# Patient Record
Sex: Male | Born: 1960 | Race: White | Hispanic: No | Marital: Married | Smoking: Never smoker
Health system: Southern US, Community
[De-identification: ages and names within clinical notes are randomized; demographics above are authoritative.]

## PROBLEM LIST (undated history)

## (undated) DIAGNOSIS — Z9989 Dependence on other enabling machines and devices: Secondary | ICD-10-CM

## (undated) DIAGNOSIS — G4733 Obstructive sleep apnea (adult) (pediatric): Secondary | ICD-10-CM

## (undated) DIAGNOSIS — G4730 Sleep apnea, unspecified: Secondary | ICD-10-CM

## (undated) DIAGNOSIS — I341 Nonrheumatic mitral (valve) prolapse: Secondary | ICD-10-CM

## (undated) DIAGNOSIS — K219 Gastro-esophageal reflux disease without esophagitis: Secondary | ICD-10-CM

## (undated) DIAGNOSIS — E039 Hypothyroidism, unspecified: Secondary | ICD-10-CM

## (undated) DIAGNOSIS — R011 Cardiac murmur, unspecified: Secondary | ICD-10-CM

## (undated) HISTORY — PX: COLONOSCOPY: SHX174

## (undated) HISTORY — DX: Sleep apnea, unspecified: G47.30

## (undated) HISTORY — PX: ESOPHAGOGASTRODUODENOSCOPY: SHX1529

## (undated) HISTORY — PX: TONSILLECTOMY: SUR1361

## (undated) HISTORY — PX: GANGLION CYST EXCISION: SHX1691

## (undated) HISTORY — PX: VASECTOMY: SHX75

---

## 2006-02-02 ENCOUNTER — Ambulatory Visit: Payer: Self-pay | Admitting: Gastroenterology

## 2006-11-17 ENCOUNTER — Ambulatory Visit: Payer: Self-pay | Admitting: Gastroenterology

## 2007-01-06 ENCOUNTER — Ambulatory Visit: Payer: Self-pay | Admitting: Gastroenterology

## 2011-09-03 LAB — HM COLONOSCOPY

## 2013-07-07 ENCOUNTER — Ambulatory Visit: Payer: Self-pay | Admitting: Family Medicine

## 2014-07-16 LAB — LIPID PANEL
CHOLESTEROL: 200 mg/dL (ref 0–200)
HDL: 38 mg/dL (ref 35–70)
LDL Cholesterol: 136 mg/dL
Triglycerides: 130 mg/dL (ref 40–160)

## 2014-07-16 LAB — TSH: TSH: 5.34 u[IU]/mL (ref <=5.90)

## 2014-07-16 LAB — BASIC METABOLIC PANEL
BUN: 16 mg/dL (ref 4–21)
CREATININE: 1.1 mg/dL (ref <=1.3)
Glucose: 96 mg/dL
POTASSIUM: 4.4 mmol/L (ref 3.4–5.3)
SODIUM: 142 mmol/L (ref 137–147)

## 2014-07-16 LAB — HEPATIC FUNCTION PANEL
ALT: 25 U/L (ref 10–40)
AST: 15 U/L (ref 14–40)
Alkaline Phosphatase: 44 U/L (ref 25–125)

## 2014-12-21 ENCOUNTER — Telehealth: Payer: Self-pay | Admitting: Gastroenterology

## 2014-12-21 NOTE — Telephone Encounter (Signed)
Ginger please return phone call. They need to make one to have his esophagus stretched.

## 2015-01-01 ENCOUNTER — Other Ambulatory Visit: Payer: Self-pay

## 2015-01-01 NOTE — Telephone Encounter (Signed)
Pt returned call to schedule EGD. Appt scheduled at [facility] for July 8th. Pt aware of location and instructions.

## 2015-01-01 NOTE — Telephone Encounter (Signed)
LVM for pt to return my call.

## 2015-01-09 ENCOUNTER — Encounter: Payer: Self-pay | Admitting: *Deleted

## 2015-01-10 NOTE — Anesthesia Preprocedure Evaluation (Addendum)
Anesthesia Evaluation  Patient identified by MRN, date of birth, ID band Patient awake    Reviewed: Allergy & Precautions, NPO status   Airway Mallampati: III  TM Distance: <3 FB Neck ROM: full    Dental no notable dental hx.    Pulmonary neg pulmonary ROS,    Pulmonary exam normal       Cardiovascular negative cardio ROS Normal cardiovascular exam Hx MV Prolapse   Neuro/Psych negative neurological ROS  negative psych ROS   GI/Hepatic Neg liver ROS, GERD-  ,  Endo/Other  Hypothyroidism   Renal/GU negative Renal ROS  negative genitourinary   Musculoskeletal   Abdominal   Peds  Hematology negative hematology ROS (+)   Anesthesia Other Findings   Reproductive/Obstetrics                             Anesthesia Physical Anesthesia Plan  ASA: II  Anesthesia Plan: MAC   Post-op Pain Management:    Induction: Intravenous  Airway Management Planned: Nasal Cannula  Additional Equipment:   Intra-op Plan:   Post-operative Plan:   Informed Consent: I have reviewed the patients History and Physical, chart, labs and discussed the procedure including the risks, benefits and alternatives for the proposed anesthesia with the patient or authorized representative who has indicated his/her understanding and acceptance.     Plan Discussed with:   Anesthesia Plan Comments:         Anesthesia Quick Evaluation

## 2015-01-17 NOTE — Discharge Instructions (Signed)

## 2015-01-18 ENCOUNTER — Encounter
Admission: RE | Disposition: A | Discharge status: Home / Self Care | Payer: Self-pay | Source: Ambulatory Visit | Attending: Gastroenterology

## 2015-01-18 ENCOUNTER — Ambulatory Visit: Payer: 59 | Admitting: Anesthesiology

## 2015-01-18 ENCOUNTER — Other Ambulatory Visit: Payer: Self-pay | Admitting: Gastroenterology

## 2015-01-18 ENCOUNTER — Ambulatory Visit
Admission: RE | Admit: 2015-01-18 | Discharge: 2015-01-18 | Disposition: A | Discharge status: Home / Self Care | Payer: 59 | Source: Ambulatory Visit | Attending: Gastroenterology | Admitting: Gastroenterology

## 2015-01-18 DIAGNOSIS — K317 Polyp of stomach and duodenum: Secondary | ICD-10-CM | POA: Diagnosis not present

## 2015-01-18 DIAGNOSIS — E039 Hypothyroidism, unspecified: Secondary | ICD-10-CM | POA: Diagnosis not present

## 2015-01-18 DIAGNOSIS — K2950 Unspecified chronic gastritis without bleeding: Secondary | ICD-10-CM | POA: Insufficient documentation

## 2015-01-18 DIAGNOSIS — K219 Gastro-esophageal reflux disease without esophagitis: Secondary | ICD-10-CM | POA: Insufficient documentation

## 2015-01-18 DIAGNOSIS — K2970 Gastritis, unspecified, without bleeding: Secondary | ICD-10-CM | POA: Diagnosis not present

## 2015-01-18 DIAGNOSIS — R1310 Dysphagia, unspecified: Secondary | ICD-10-CM | POA: Insufficient documentation

## 2015-01-18 DIAGNOSIS — Z79899 Other long term (current) drug therapy: Secondary | ICD-10-CM | POA: Diagnosis not present

## 2015-01-18 DIAGNOSIS — K222 Esophageal obstruction: Secondary | ICD-10-CM

## 2015-01-18 HISTORY — PX: ESOPHAGOGASTRODUODENOSCOPY (EGD) WITH PROPOFOL: SHX5813

## 2015-01-18 HISTORY — DX: Nonrheumatic mitral (valve) prolapse: I34.1

## 2015-01-18 HISTORY — DX: Hypothyroidism, unspecified: E03.9

## 2015-01-18 HISTORY — DX: Gastro-esophageal reflux disease without esophagitis: K21.9

## 2015-01-18 SURGERY — ESOPHAGOGASTRODUODENOSCOPY (EGD) WITH PROPOFOL
Anesthesia: Monitor Anesthesia Care | Wound class: Clean Contaminated

## 2015-01-18 MED ORDER — ACETAMINOPHEN 325 MG PO TABS: 325.0000 mg | ORAL_TABLET | ORAL | Status: DC | PRN

## 2015-01-18 MED ORDER — LACTATED RINGERS IV SOLN
INTRAVENOUS | Status: DC
  Administered 2015-01-18: 11:00:00 via INTRAVENOUS

## 2015-01-18 MED ORDER — LACTATED RINGERS IV SOLN: INTRAVENOUS | Status: DC

## 2015-01-18 MED ORDER — SODIUM CHLORIDE 0.9 % IV SOLN: INTRAVENOUS | Status: DC

## 2015-01-18 MED ORDER — LIDOCAINE HCL (CARDIAC) 20 MG/ML IV SOLN
INTRAVENOUS | Status: DC | PRN
  Administered 2015-01-18: 50 mg via INTRAVENOUS

## 2015-01-18 MED ORDER — ACETAMINOPHEN 160 MG/5ML PO SOLN: 325.0000 mg | ORAL | Status: DC | PRN

## 2015-01-18 MED ORDER — PROPOFOL 10 MG/ML IV BOLUS
INTRAVENOUS | Status: DC | PRN
  Administered 2015-01-18 (×3): 30 mg via INTRAVENOUS
  Administered 2015-01-18: 70 mg via INTRAVENOUS
  Administered 2015-01-18: 40 mg via INTRAVENOUS

## 2015-01-18 MED ORDER — SODIUM CHLORIDE 0.9 % IV SOLN
1.5000 g | Freq: Once | INTRAVENOUS | Status: AC
  Administered 2015-01-18: 1.5 g via INTRAVENOUS

## 2015-01-18 MED ORDER — GLYCOPYRROLATE 0.2 MG/ML IJ SOLN
INTRAMUSCULAR | Status: DC | PRN
  Administered 2015-01-18: 0.2 mg via INTRAVENOUS

## 2015-01-18 SURGICAL SUPPLY — 39 items
BALLN DILATOR 10-12 8 (BALLOONS)
BALLN DILATOR 12-15 8 (BALLOONS) ×3
BALLN DILATOR 15-18 8 (BALLOONS)
BALLN DILATOR CRE 0-12 8 (BALLOONS)
BALLN DILATOR ESOPH 8 10 CRE (MISCELLANEOUS) IMPLANT
BALLOON DILATOR 12-15 8 (BALLOONS) ×1 IMPLANT
BALLOON DILATOR 15-18 8 (BALLOONS) IMPLANT
BALLOON DILATOR CRE 0-12 8 (BALLOONS) IMPLANT
BLOCK BITE 60FR ADLT L/F GRN (MISCELLANEOUS) ×3 IMPLANT
CANISTER SUCT 1200ML W/VALVE (MISCELLANEOUS) ×3 IMPLANT
FCP ESCP3.2XJMB 240X2.8X (MISCELLANEOUS)
FORCEPS BIOP RAD 4 LRG CAP 4 (CUTTING FORCEPS) ×3 IMPLANT
FORCEPS BIOP RJ4 240 W/NDL (MISCELLANEOUS)
FORCEPS ESCP3.2XJMB 240X2.8X (MISCELLANEOUS) IMPLANT
GOWN CVR UNV OPN BCK APRN NK (MISCELLANEOUS) ×2 IMPLANT
GOWN ISOL THUMB LOOP REG UNIV (MISCELLANEOUS) ×4
HEMOCLIP INSTINCT (CLIP) IMPLANT
INJECTOR VARIJECT VIN23 (MISCELLANEOUS) IMPLANT
KIT CO2 TUBING (TUBING) IMPLANT
KIT DEFENDO VALVE AND CONN (KITS) IMPLANT
KIT ENDO PROCEDURE OLY (KITS) ×3 IMPLANT
LIGATOR MULTIBAND 6SHOOTER MBL (MISCELLANEOUS) IMPLANT
MARKER SPOT ENDO TATTOO 5ML (MISCELLANEOUS) IMPLANT
PAD GROUND ADULT SPLIT (MISCELLANEOUS) IMPLANT
SNARE SHORT THROW 13M SML OVAL (MISCELLANEOUS) IMPLANT
SNARE SHORT THROW 30M LRG OVAL (MISCELLANEOUS) IMPLANT
SPOT EX ENDOSCOPIC TATTOO (MISCELLANEOUS)
SUCTION POLY TRAP 4CHAMBER (MISCELLANEOUS) IMPLANT
SYR INFLATION 60ML (SYRINGE) ×3 IMPLANT
TRAP SUCTION POLY (MISCELLANEOUS) IMPLANT
TUBING CONN 6MMX3.1M (TUBING)
TUBING SUCTION CONN 0.25 STRL (TUBING) IMPLANT
UNDERPAD 30X60 958B10 (PK) (MISCELLANEOUS) IMPLANT
VALVE BIOPSY ENDO (VALVE) IMPLANT
VARIJECT INJECTOR VIN23 (MISCELLANEOUS)
WATER AUXILLARY (MISCELLANEOUS) IMPLANT
WATER STERILE IRR 250ML POUR (IV SOLUTION) ×3 IMPLANT
WATER STERILE IRR 500ML POUR (IV SOLUTION) IMPLANT
WIRE CRE 18-20MM 8CM F G (MISCELLANEOUS) IMPLANT

## 2015-01-18 NOTE — Op Note (Signed)
[facility] Gastroenterology Patient Name: Christopher Parsons Procedure Date: 01/18/2015 11:41 AM MRN: [id number] Account #: [id number] Date of Birth: 09-Mar-1961 Admit Type: Outpatient Age: 54 Room: [facility] OR ROOM 01 Gender: Male Note Status: Finalized Procedure:         Upper GI endoscopy Indications:       Dysphagia Providers:         [name], MD Referring MD:      Janine Ores. Rosanna Randy, MD (Referring MD) Medicines:         Propofol per Anesthesia Complications:     No immediate complications. Procedure:         Pre-Anesthesia Assessment:                    - Prior to the procedure, a History and Physical was                     performed, and patient medications and allergies were                     reviewed. The patient's tolerance of previous anesthesia                     was also reviewed. The risks and benefits of the procedure                     and the sedation options and risks were discussed with the                     patient. All questions were answered, and informed consent                     was obtained. Prior Anticoagulants: The patient has taken                     no previous anticoagulant or antiplatelet agents. ASA                     Grade Assessment: II - A patient with mild systemic                     disease. After reviewing the risks and benefits, the                     patient was deemed in satisfactory condition to undergo                     the procedure.                    After obtaining informed consent, the endoscope was passed                     under direct vision. Throughout the procedure, the                     patient's blood pressure, pulse, and oxygen saturations                     were monitored continuously. The Olympus GIF H180J                     colonscope (Z#:[id number]) was introduced through the mouth,  and advanced to the second part of duodenum. The upper GI                     endoscopy  was accomplished without difficulty. The patient                     tolerated the procedure well. Findings:      A benign-appearing, intrinsic moderate stenosis measuring 1 cm (inner       diameter) was found at the gastroesophageal junction and was traversed.       A TTS dilator was passed through the scope. Dilation with a 12-13.5-15       mm balloon (to a maximum balloon size of 15 mm) dilator was performed.      Localized mild inflammation characterized by erythema was found in the       gastric antrum. Biopsies were taken with a cold forceps for histology.      A few 5 mm sessile polyps with no stigmata of recent bleeding were found       in the gastric body.      The examined duodenum was normal.      Gastric hernia in the fundus Impression:        - Benign-appearing esophageal stricture. Dilated.                    - Gastritis. Biopsied.                    - A few gastric polyps.                    - Normal examined duodenum. Recommendation:    - Await pathology results.                    - Repeat the upper endoscopy in 4 weeks for retreatment. Procedure Code(s): --- Professional ---                    [phone], Esophagogastroduodenoscopy, flexible, transoral;                     with transendoscopic balloon dilation of esophagus (less                     than 30 mm diameter)                    43239, Esophagogastroduodenoscopy, flexible, transoral;                     with biopsy, single or multiple Diagnosis Code(s): --- Professional ---                    R13.10, Dysphagia, unspecified                    K22.2, Esophageal obstruction                    K29.70, Gastritis, unspecified, without bleeding                    K31.7, Polyp of stomach and duodenum CPT copyright 2014 American Medical Association. All rights reserved. The codes documented in this report are preliminary and upon coder review may  be revised to meet current compliance requirements. [name],  MD 01/18/2015 11:54:49 AM This report has been signed electronically. Number  of Addenda: 0 Note Initiated On: 01/18/2015 11:41 AM Total Procedure Duration: 0 hours 5 minutes 15 seconds       [facility]

## 2015-01-18 NOTE — Transfer of Care (Signed)
Immediate Anesthesia Transfer of Care Note  Patient: Christopher Parsons  Procedure(s) Performed: Procedure(s): ESOPHAGOGASTRODUODENOSCOPY (EGD) WITH PROPOFOL with dialtion (N/A)  Patient Location: PACU  Anesthesia Type: MAC  Level of Consciousness: awake, alert  and patient cooperative  Airway and Oxygen Therapy: Patient Spontanous Breathing and Patient connected to supplemental oxygen  Post-op Assessment: Post-op Vital signs reviewed, Patient's Cardiovascular Status Stable, Respiratory Function Stable, Patent Airway and No signs of Nausea or vomiting  Post-op Vital Signs: Reviewed and stable  Complications: No apparent anesthesia complications

## 2015-01-18 NOTE — Anesthesia Postprocedure Evaluation (Signed)
  Anesthesia Post-op Note  Patient: Christopher Parsons  Procedure(s) Performed: Procedure(s): ESOPHAGOGASTRODUODENOSCOPY (EGD) WITH PROPOFOL with dialtion (N/A)  Anesthesia type:MAC  Patient location: PACU  Post pain: Pain level controlled  Post assessment: Post-op Vital signs reviewed, Patient's Cardiovascular Status Stable, Respiratory Function Stable, Patent Airway and No signs of Nausea or vomiting  Post vital signs: Reviewed and stable  Last Vitals:  Filed Vitals:   01/18/15 1215  BP: 110/74  Pulse: 56  Temp:   Resp: 19    Level of consciousness: awake, alert  and patient cooperative  Complications: No apparent anesthesia complications

## 2015-01-18 NOTE — Anesthesia Procedure Notes (Signed)
Procedure Name: MAC Performed by: Wilho Sharpley Pre-anesthesia Checklist: Patient identified, Emergency Drugs available, Suction available, Timeout performed and Patient being monitored Patient Re-evaluated:Patient Re-evaluated prior to inductionOxygen Delivery Method: Nasal cannula Placement Confirmation: positive ETCO2       

## 2015-01-18 NOTE — H&P (Signed)
  [facility]  [address] [place], [place] [address] Phone: [phone] Fax : [phone]  Primary Care Physician:  Wilhemena Durie, MD Primary Gastroenterologist:  Dr. Allen Norris  Pre-Procedure History & Physical: HPI:  Christopher Parsons is a 54 y.o. male is here for an endoscopy.   Past Medical History  Diagnosis Date  . Mitral valve prolapse     echo 12/14 on paper chart  . Hypothyroidism   . GERD (gastroesophageal reflux disease)     Past Surgical History  Procedure Laterality Date  . Ganglion cyst excision Left     wrist  . Tonsillectomy    . Colonoscopy    . Esophagogastroduodenoscopy      Prior to Admission medications   Medication Sig Start Date End Date Taking? Authorizing Provider  levothyroxine (SYNTHROID, LEVOTHROID) 50 MCG tablet Take 50 mcg by mouth daily before breakfast.   Yes Historical Provider, MD  omeprazole (PRILOSEC) 40 MG capsule Take 40 mg by mouth daily. PM   Yes Historical Provider, MD    Allergies as of 01/01/2015  . (Not on File)    History reviewed. No pertinent family history.  History   Social History  . Marital Status: Married    Spouse Name: N/A  . Number of Children: N/A  . Years of Education: N/A   Occupational History  . Not on file.   Social History Main Topics  . Smoking status: Never Smoker   . Smokeless tobacco: Not on file  . Alcohol Use: Not on file  . Drug Use: Not on file  . Sexual Activity: Not on file   Other Topics Concern  . Not on file   Social History Narrative  . No narrative on file    Review of Systems: See HPI, otherwise negative ROS  Physical Exam: BP 112/77 mmHg  Pulse 54  Temp(Src) 97.3 F (36.3 C) (Temporal)  Resp 16  Ht 5\' 10"  (1.778 m)  Wt 207 lb (93.895 kg)  BMI 29.70 kg/m2  SpO2 98% General:   Alert,  pleasant and cooperative in NAD Head:  Normocephalic and atraumatic. Neck:  Supple; no masses or thyromegaly. Lungs:  Clear throughout to auscultation.     Heart:  Regular rate and rhythm. Abdomen:  Soft, nontender and nondistended. Normal bowel sounds, without guarding, and without rebound.   Neurologic:  Alert and  oriented x4;  grossly normal neurologically.  Impression/Plan: Christopher Parsons is here for an endoscopy to be performed for dysphagia  Risks, benefits, limitations, and alternatives regarding  endoscopy have been reviewed with the patient.  Questions have been answered.  All parties agreeable.   Ollen Bowl, MD  01/18/2015, 11:14 AM

## 2015-01-21 ENCOUNTER — Encounter: Payer: Self-pay | Admitting: Gastroenterology

## 2015-02-05 ENCOUNTER — Encounter: Payer: Self-pay | Admitting: Gastroenterology

## 2015-04-03 ENCOUNTER — Other Ambulatory Visit: Payer: Self-pay | Admitting: Family Medicine

## 2015-04-08 ENCOUNTER — Telehealth: Payer: Self-pay | Admitting: Gastroenterology

## 2015-04-08 NOTE — Telephone Encounter (Signed)
Needs an appt. To have esophagus stretched and would like Oct 26

## 2015-04-09 NOTE — Telephone Encounter (Signed)
LVM for pt to return my call to schedule EGD.   

## 2015-04-11 NOTE — Telephone Encounter (Signed)
Mailed pt a letter to contact our office to schedule.

## 2015-04-11 NOTE — Telephone Encounter (Signed)
LVM again for pt to return my call to schedule.

## 2015-04-15 ENCOUNTER — Other Ambulatory Visit: Payer: Self-pay

## 2015-05-13 ENCOUNTER — Encounter: Payer: Self-pay | Admitting: Anesthesiology

## 2015-05-16 NOTE — Discharge Instructions (Signed)

## 2015-05-17 ENCOUNTER — Ambulatory Visit: Payer: 59 | Admitting: Anesthesiology

## 2015-05-17 ENCOUNTER — Encounter
Admission: RE | Disposition: A | Discharge status: Home / Self Care | Payer: Self-pay | Source: Ambulatory Visit | Attending: Gastroenterology

## 2015-05-17 ENCOUNTER — Ambulatory Visit
Admission: RE | Admit: 2015-05-17 | Discharge: 2015-05-17 | Disposition: A | Discharge status: Home / Self Care | Payer: 59 | Source: Ambulatory Visit | Attending: Gastroenterology | Admitting: Gastroenterology

## 2015-05-17 DIAGNOSIS — Z9889 Other specified postprocedural states: Secondary | ICD-10-CM | POA: Diagnosis not present

## 2015-05-17 DIAGNOSIS — K219 Gastro-esophageal reflux disease without esophagitis: Secondary | ICD-10-CM | POA: Diagnosis not present

## 2015-05-17 DIAGNOSIS — Z79899 Other long term (current) drug therapy: Secondary | ICD-10-CM | POA: Insufficient documentation

## 2015-05-17 DIAGNOSIS — I341 Nonrheumatic mitral (valve) prolapse: Secondary | ICD-10-CM | POA: Insufficient documentation

## 2015-05-17 DIAGNOSIS — K222 Esophageal obstruction: Secondary | ICD-10-CM | POA: Diagnosis not present

## 2015-05-17 DIAGNOSIS — R1310 Dysphagia, unspecified: Secondary | ICD-10-CM | POA: Insufficient documentation

## 2015-05-17 DIAGNOSIS — E039 Hypothyroidism, unspecified: Secondary | ICD-10-CM | POA: Insufficient documentation

## 2015-05-17 HISTORY — PX: ESOPHAGOGASTRODUODENOSCOPY (EGD) WITH PROPOFOL: SHX5813

## 2015-05-17 SURGERY — ESOPHAGOGASTRODUODENOSCOPY (EGD) WITH PROPOFOL
Anesthesia: Monitor Anesthesia Care | Wound class: Clean Contaminated

## 2015-05-17 MED ORDER — LACTATED RINGERS IV SOLN
INTRAVENOUS | Status: DC
  Administered 2015-05-17 (×2): via INTRAVENOUS

## 2015-05-17 MED ORDER — OXYCODONE HCL 5 MG/5ML PO SOLN: 5.0000 mg | Freq: Once | ORAL | Status: DC | PRN

## 2015-05-17 MED ORDER — LIDOCAINE HCL (CARDIAC) 20 MG/ML IV SOLN
INTRAVENOUS | Status: DC | PRN
  Administered 2015-05-17: 30 mg via INTRAVENOUS

## 2015-05-17 MED ORDER — PROPOFOL 10 MG/ML IV BOLUS
INTRAVENOUS | Status: DC | PRN
  Administered 2015-05-17: 50 mg via INTRAVENOUS
  Administered 2015-05-17: 100 mg via INTRAVENOUS
  Administered 2015-05-17: 50 mg via INTRAVENOUS

## 2015-05-17 MED ORDER — OXYCODONE HCL 5 MG PO TABS: 5.0000 mg | ORAL_TABLET | Freq: Once | ORAL | Status: DC | PRN

## 2015-05-17 MED ORDER — PROMETHAZINE HCL 25 MG/ML IJ SOLN: 6.2500 mg | INTRAMUSCULAR | Status: DC | PRN

## 2015-05-17 MED ORDER — GLYCOPYRROLATE 0.2 MG/ML IJ SOLN
INTRAMUSCULAR | Status: DC | PRN
  Administered 2015-05-17: 0.2 mg via INTRAVENOUS

## 2015-05-17 MED ORDER — STERILE WATER FOR IRRIGATION IR SOLN
Status: DC | PRN
  Administered 2015-05-17: 11:00:00

## 2015-05-17 MED ORDER — HYDROMORPHONE HCL 1 MG/ML IJ SOLN: 0.2500 mg | INTRAMUSCULAR | Status: DC | PRN

## 2015-05-17 MED ORDER — MEPERIDINE HCL 25 MG/ML IJ SOLN: 6.2500 mg | INTRAMUSCULAR | Status: DC | PRN

## 2015-05-17 SURGICAL SUPPLY — 39 items
BALLN DILATOR 10-12 8 (BALLOONS)
BALLN DILATOR 12-15 8 (BALLOONS)
BALLN DILATOR 15-18 8 (BALLOONS) ×3
BALLN DILATOR CRE 0-12 8 (BALLOONS)
BALLN DILATOR ESOPH 8 10 CRE (MISCELLANEOUS) IMPLANT
BALLOON DILATOR 12-15 8 (BALLOONS) IMPLANT
BALLOON DILATOR 15-18 8 (BALLOONS) ×1 IMPLANT
BALLOON DILATOR CRE 0-12 8 (BALLOONS) IMPLANT
BLOCK BITE 60FR ADLT L/F GRN (MISCELLANEOUS) ×3 IMPLANT
CANISTER SUCT 1200ML W/VALVE (MISCELLANEOUS) ×3 IMPLANT
FCP ESCP3.2XJMB 240X2.8X (MISCELLANEOUS)
FORCEPS BIOP RAD 4 LRG CAP 4 (CUTTING FORCEPS) IMPLANT
FORCEPS BIOP RJ4 240 W/NDL (MISCELLANEOUS)
FORCEPS ESCP3.2XJMB 240X2.8X (MISCELLANEOUS) IMPLANT
GOWN CVR UNV OPN BCK APRN NK (MISCELLANEOUS) ×2 IMPLANT
GOWN ISOL THUMB LOOP REG UNIV (MISCELLANEOUS) ×4
HEMOCLIP INSTINCT (CLIP) IMPLANT
INJECTOR VARIJECT VIN23 (MISCELLANEOUS) IMPLANT
KIT CO2 TUBING (TUBING) IMPLANT
KIT DEFENDO VALVE AND CONN (KITS) IMPLANT
KIT ENDO PROCEDURE OLY (KITS) ×3 IMPLANT
LIGATOR MULTIBAND 6SHOOTER MBL (MISCELLANEOUS) IMPLANT
MARKER SPOT ENDO TATTOO 5ML (MISCELLANEOUS) IMPLANT
PAD GROUND ADULT SPLIT (MISCELLANEOUS) IMPLANT
SNARE SHORT THROW 13M SML OVAL (MISCELLANEOUS) IMPLANT
SNARE SHORT THROW 30M LRG OVAL (MISCELLANEOUS) IMPLANT
SPOT EX ENDOSCOPIC TATTOO (MISCELLANEOUS)
SUCTION POLY TRAP 4CHAMBER (MISCELLANEOUS) IMPLANT
SYR INFLATION 60ML (SYRINGE) ×3 IMPLANT
TRAP SUCTION POLY (MISCELLANEOUS) IMPLANT
TUBING CONN 6MMX3.1M (TUBING)
TUBING SUCTION CONN 0.25 STRL (TUBING) IMPLANT
UNDERPAD 30X60 958B10 (PK) (MISCELLANEOUS) IMPLANT
VALVE BIOPSY ENDO (VALVE) IMPLANT
VARIJECT INJECTOR VIN23 (MISCELLANEOUS)
WATER AUXILLARY (MISCELLANEOUS) IMPLANT
WATER STERILE IRR 250ML POUR (IV SOLUTION) ×3 IMPLANT
WATER STERILE IRR 500ML POUR (IV SOLUTION) IMPLANT
WIRE CRE 18-20MM 8CM F G (MISCELLANEOUS) IMPLANT

## 2015-05-17 NOTE — H&P (Signed)
  [facility]  [address] [place], [place] [address] Phone: [phone] Fax : [phone]  Primary Care Physician:  Wilhemena Durie, MD Primary Gastroenterologist:  Dr. Allen Norris  Pre-Procedure History & Physical: HPI:  Christopher Parsons is a 54 y.o. male is here for an endoscopy.   Past Medical History  Diagnosis Date  . Mitral valve prolapse     echo 12/14 on paper chart  . Hypothyroidism   . GERD (gastroesophageal reflux disease)     Past Surgical History  Procedure Laterality Date  . Ganglion cyst excision Left     wrist  . Tonsillectomy    . Colonoscopy    . Esophagogastroduodenoscopy    . Esophagogastroduodenoscopy (egd) with propofol N/A 01/18/2015    Procedure: ESOPHAGOGASTRODUODENOSCOPY (EGD) WITH PROPOFOL with dialtion;  Surgeon: [name], MD;  Location: [place];  Service: Endoscopy;  Laterality: N/A;    Prior to Admission medications   Medication Sig Start Date End Date Taking? Authorizing Provider  levothyroxine (SYNTHROID, LEVOTHROID) 50 MCG tablet Take 1 tablet by mouth  daily 04/03/15  Yes Ebubechukwu Maceo Pro., MD  omeprazole (PRILOSEC) 40 MG capsule Take 40 mg by mouth daily. PM   Yes Historical Provider, MD    Allergies as of 04/15/2015  . (No Known Allergies)    History reviewed. No pertinent family history.  Social History   Social History  . Marital Status: Married    Spouse Name: N/A  . Number of Children: N/A  . Years of Education: N/A   Occupational History  . Not on file.   Social History Main Topics  . Smoking status: Never Smoker   . Smokeless tobacco: Not on file  . Alcohol Use: Not on file  . Drug Use: Not on file  . Sexual Activity: Not on file   Other Topics Concern  . Not on file   Social History Narrative    Review of Systems: See HPI, otherwise negative ROS  Physical Exam: BP 123/84 mmHg  Pulse 54  Temp(Src) 97.7 F (36.5 C)  Resp 16  Ht 5\' 10"  (1.778 m)  Wt 204 lb  (92.534 kg)  BMI 29.27 kg/m2  SpO2 98% General:   Alert,  pleasant and cooperative in NAD Head:  Normocephalic and atraumatic. Neck:  Supple; no masses or thyromegaly. Lungs:  Clear throughout to auscultation.    Heart:  Regular rate and rhythm. Abdomen:  Soft, nontender and nondistended. Normal bowel sounds, without guarding, and without rebound.   Neurologic:  Alert and  oriented x4;  grossly normal neurologically.  Impression/Plan: Christopher Parsons is here for an endoscopy to be performed for dysphagia  Risks, benefits, limitations, and alternatives regarding  endoscopy have been reviewed with the patient.  Questions have been answered.  All parties agreeable.   Ollen Bowl, MD  05/17/2015, 10:22 AM

## 2015-05-17 NOTE — Anesthesia Preprocedure Evaluation (Signed)
Anesthesia Evaluation  Patient identified by MRN, date of birth, ID band Patient awake    Reviewed: Allergy & Precautions, NPO status , Patient's Chart, lab work & pertinent test results, reviewed documented beta blocker date and time   Airway Mallampati: II  TM Distance: >3 FB Neck ROM: Full    Dental no notable dental hx.    Pulmonary neg pulmonary ROS,    Pulmonary exam normal        Cardiovascular negative cardio ROS Normal cardiovascular exam+ Valvular Problems/Murmurs MVP      Neuro/Psych negative neurological ROS  negative psych ROS   GI/Hepatic GERD  Controlled and Medicated,  Endo/Other  Hypothyroidism   Renal/GU      Musculoskeletal negative musculoskeletal ROS (+)   Abdominal   Peds  Hematology negative hematology ROS (+)   Anesthesia Other Findings   Reproductive/Obstetrics                             Anesthesia Physical Anesthesia Plan  ASA: II  Anesthesia Plan: MAC   Post-op Pain Management:    Induction:   Airway Management Planned:   Additional Equipment:   Intra-op Plan:   Post-operative Plan:   Informed Consent: I have reviewed the patients History and Physical, chart, labs and discussed the procedure including the risks, benefits and alternatives for the proposed anesthesia with the patient or authorized representative who has indicated his/her understanding and acceptance.     Plan Discussed with: CRNA  Anesthesia Plan Comments:         Anesthesia Quick Evaluation

## 2015-05-17 NOTE — Op Note (Signed)
[facility] Gastroenterology Patient Name: Christopher Parsons Procedure Date: 05/17/2015 10:19 AM MRN: [id number] Account #: [id number] Date of Birth: 1961/03/04 Admit Type: Outpatient Age: 54 Room: [facility] OR ROOM 01 Gender: Male Note Status: Finalized Procedure:         Upper GI endoscopy Indications:       Dysphagia Providers:         [name], MD Referring MD:      Janine Ores. Rosanna Randy, MD (Referring MD) Medicines:         Propofol per Anesthesia Complications:     No immediate complications. Procedure:         Pre-Anesthesia Assessment:                    - Prior to the procedure, a History and Physical was                     performed, and patient medications and allergies were                     reviewed. The patient's tolerance of previous anesthesia                     was also reviewed. The risks and benefits of the procedure                     and the sedation options and risks were discussed with the                     patient. All questions were answered, and informed consent                     was obtained. Prior Anticoagulants: The patient has taken                     no previous anticoagulant or antiplatelet agents. ASA                     Grade Assessment: II - A patient with mild systemic                     disease. After reviewing the risks and benefits, the                     patient was deemed in satisfactory condition to undergo                     the procedure.                    After obtaining informed consent, the endoscope was passed                     under direct vision. Throughout the procedure, the                     patient's blood pressure, pulse, and oxygen saturations                     were monitored continuously. The Olympus GIF H180J                     endoscope (S#: [id number]) was introduced through the mouth,  and advanced to the second part of duodenum. The upper GI                     endoscopy  was accomplished without difficulty. The patient                     tolerated the procedure well. Findings:      A benign-appearing, intrinsic moderate stenosis was found at the       gastroesophageal junction and was traversed. A TTS dilator was passed       through the scope. Dilation with a 15-16.5-18 mm balloon (to a maximum       balloon size of 16.5 mm) dilator was performed. The dilation site was       examined following endoscope reinsertion and showed moderate improvement       in luminal narrowing.      The stomach was normal.      The examined duodenum was normal. Impression:        - Benign-appearing esophageal stricture. Dilated.                    - Normal stomach.                    - Normal examined duodenum.                    - No specimens collected. Recommendation:    - Repeat the upper endoscopy PRN for retreatment. Procedure Code(s): --- Professional ---                    [phone], Esophagogastroduodenoscopy, flexible, transoral;                     with transendoscopic balloon dilation of esophagus (less                     than 30 mm diameter) Diagnosis Code(s): --- Professional ---                    R13.10, Dysphagia, unspecified                    K22.2, Esophageal obstruction CPT copyright 2014 American Medical Association. All rights reserved. The codes documented in this report are preliminary and upon coder review may  be revised to meet current compliance requirements. [name], MD 05/17/2015 10:39:18 AM This report has been signed electronically. Number of Addenda: 0 Note Initiated On: 05/17/2015 10:19 AM      [facility]

## 2015-05-17 NOTE — Anesthesia Procedure Notes (Signed)
Procedure Name: MAC Performed by: Louretta Tantillo Pre-anesthesia Checklist: Patient identified, Emergency Drugs available, Suction available, Patient being monitored and Timeout performed Patient Re-evaluated:Patient Re-evaluated prior to inductionOxygen Delivery Method: Nasal cannula       

## 2015-05-17 NOTE — Transfer of Care (Signed)
Immediate Anesthesia Transfer of Care Note  Patient: Christopher Parsons  Procedure(s) Performed: Procedure(s): ESOPHAGOGASTRODUODENOSCOPY (EGD) WITH PROPOFOL, WITH DIALATION (N/A)  Patient Location: PACU  Anesthesia Type: MAC  Level of Consciousness: awake, alert  and patient cooperative  Airway and Oxygen Therapy: Patient Spontanous Breathing and Patient connected to supplemental oxygen  Post-op Assessment: Post-op Vital signs reviewed, Patient's Cardiovascular Status Stable, Respiratory Function Stable, Patent Airway and No signs of Nausea or vomiting  Post-op Vital Signs: Reviewed and stable  Complications: No apparent anesthesia complications

## 2015-05-17 NOTE — Anesthesia Postprocedure Evaluation (Signed)
  Anesthesia Post-op Note  Patient: Christopher Parsons  Procedure(s) Performed: Procedure(s): ESOPHAGOGASTRODUODENOSCOPY (EGD) WITH PROPOFOL, WITH DIALATION (N/A)  Anesthesia type:MAC  Patient location: PACU  Post pain: Pain level controlled  Post assessment: Post-op Vital signs reviewed, Patient's Cardiovascular Status Stable, Respiratory Function Stable, Patent Airway and No signs of Nausea or vomiting  Post vital signs: Reviewed and stable  Last Vitals:  Filed Vitals:   05/17/15 1045  BP: 125/91  Pulse: 55  Temp:   Resp: 14    Level of consciousness: awake, alert  and patient cooperative  Complications: No apparent anesthesia complications

## 2015-05-20 ENCOUNTER — Encounter: Payer: Self-pay | Admitting: Gastroenterology

## 2015-06-27 DIAGNOSIS — S0291XA Unspecified fracture of skull, initial encounter for closed fracture: Secondary | ICD-10-CM | POA: Insufficient documentation

## 2015-06-27 DIAGNOSIS — E039 Hypothyroidism, unspecified: Secondary | ICD-10-CM | POA: Insufficient documentation

## 2015-06-27 DIAGNOSIS — R0683 Snoring: Secondary | ICD-10-CM | POA: Insufficient documentation

## 2015-06-27 DIAGNOSIS — E7800 Pure hypercholesterolemia, unspecified: Secondary | ICD-10-CM | POA: Insufficient documentation

## 2015-07-03 ENCOUNTER — Encounter: Payer: Self-pay | Admitting: Family Medicine

## 2015-07-03 ENCOUNTER — Ambulatory Visit (INDEPENDENT_AMBULATORY_CARE_PROVIDER_SITE_OTHER): Payer: 59 | Admitting: Family Medicine

## 2015-07-03 VITALS — BP 118/68 | HR 68 | Temp 97.9°F | Resp 16 | Ht 70.0 in | Wt 212.0 lb

## 2015-07-03 DIAGNOSIS — Z125 Encounter for screening for malignant neoplasm of prostate: Secondary | ICD-10-CM | POA: Diagnosis not present

## 2015-07-03 DIAGNOSIS — Z1211 Encounter for screening for malignant neoplasm of colon: Secondary | ICD-10-CM

## 2015-07-03 DIAGNOSIS — E039 Hypothyroidism, unspecified: Secondary | ICD-10-CM | POA: Diagnosis not present

## 2015-07-03 DIAGNOSIS — Z0000 Encounter for general adult medical examination without abnormal findings: Secondary | ICD-10-CM

## 2015-07-03 LAB — IFOBT (OCCULT BLOOD): IMMUNOLOGICAL FECAL OCCULT BLOOD TEST: NEGATIVE

## 2015-07-03 NOTE — Progress Notes (Signed)
Patient ID: Christopher Parsons, male   DOB: 05-Nov-1960, 54 y.o.   MRN: [id number]       Patient: Christopher Parsons, Male    DOB: 1960-09-30, 54 y.o.   MRN: [id number] Visit Date: 07/03/2015  Today's Provider: Wilhemena Durie, MD   Chief Complaint  Patient presents with  . Annual Exam   Subjective:    Annual physical exam Christopher Parsons is a 54 y.o. male who presents today for health maintenance and complete physical. He feels well. He reports exercising not regularly. He reports he is sleeping fairly well. He sleeps on average 6 hours a night.   ----------------------------------------------------------------- Last:  Colonoscopy- 08/14/2011  Tdap- 06/23/2010      Review of Systems  Constitutional: Negative.   HENT: Negative.   Eyes: Negative.   Respiratory: Negative.   Cardiovascular: Negative.   Gastrointestinal: Negative.   Endocrine: Negative.   Genitourinary: Negative.   Musculoskeletal: Negative.   Skin: Negative.   Allergic/Immunologic: Negative.   Neurological: Negative.   Hematological: Negative.   Psychiatric/Behavioral: Negative.     Social History      He  reports that he has never smoked. He does not have any smokeless tobacco history on file. He reports that he drinks about 3.0 oz of alcohol per week. He reports that he does not use illicit drugs.       Social History   Social History  . Marital Status: Married    Spouse Name: N/A  . Number of Children: 3  . Years of Education: 57   Social History Main Topics  . Smoking status: Never Smoker   . Smokeless tobacco: None  . Alcohol Use: 3.0 oz/week    5 Shots of liquor per week  . Drug Use: No  . Sexual Activity: Not Asked   Other Topics Concern  . None   Social History Narrative    Patient Active Problem List   Diagnosis Date Noted  . Acquired hypothyroidism 06/27/2015  . Fracture of skull ([place]) 06/27/2015  . Hypercholesterolemia 06/27/2015  . Adult hypothyroidism 06/27/2015  .  Snores 06/27/2015  . Problems with swallowing and mastication   . Swallowing difficulty   . Stricture and stenosis of esophagus   . Gastritis   . Vitamin D deficiency 11/18/2009  . Acid reflux 05/23/2009  . Cardiac murmur 05/23/2009    Past Surgical History  Procedure Laterality Date  . Ganglion cyst excision Left     wrist  . Tonsillectomy    . Colonoscopy    . Esophagogastroduodenoscopy    . Esophagogastroduodenoscopy (egd) with propofol N/A 01/18/2015    Procedure: ESOPHAGOGASTRODUODENOSCOPY (EGD) WITH PROPOFOL with dialtion;  Surgeon: [name], MD;  Location: [place];  Service: Endoscopy;  Laterality: N/A;  . Esophagogastroduodenoscopy (egd) with propofol N/A 05/17/2015    Procedure: ESOPHAGOGASTRODUODENOSCOPY (EGD) WITH PROPOFOL, WITH DIALATION;  Surgeon: [name], MD;  Location: [place];  Service: Endoscopy;  Laterality: N/A;    Family History        Family Status  Relation Status Death Age  . Mother Alive   . Father Deceased   . Sister Alive   . Brother Alive   . Brother Alive         His family history includes Cancer (age of onset: 5) in his father; Healthy in his brother, brother, and sister; Hyperlipidemia in his mother.    No Known Allergies  Previous Medications   LEVOTHYROXINE (SYNTHROID, LEVOTHROID) 50 MCG TABLET  Take 1 tablet by mouth  daily   OMEPRAZOLE (PRILOSEC) 40 MG CAPSULE    Take 40 mg by mouth daily. PM   VITAMIN D, ERGOCALCIFEROL, (DRISDOL) 50000 UNITS CAPS CAPSULE    Take 1 capsule by mouth once a week.    Patient Care Team: Jerrol Banana., MD as PCP - General (Family Medicine)     Objective:   Vitals: BP 118/68 mmHg  Pulse 68  Temp(Src) 97.9 F (36.6 C)  Resp 16  Ht 5\' 10"  (1.778 m)  Wt 212 lb (96.163 kg)  BMI 30.42 kg/m2   Physical Exam  Constitutional: He is oriented to person, place, and time. He appears well-developed and well-nourished.  HENT:  Head: Normocephalic and atraumatic.  Right  Ear: External ear normal.  Left Ear: External ear normal.  Nose: Nose normal.  Eyes: Conjunctivae are normal.  Neck: Neck supple.  Cardiovascular: Normal rate, regular rhythm, normal heart sounds and intact distal pulses.   Pulmonary/Chest: Effort normal and breath sounds normal.  Abdominal: Soft.  Genitourinary: Rectum normal, prostate normal and penis normal.  Left testicle rides eye. It is actually in the inguinal canal today.  Neurological: He is alert and oriented to person, place, and time.  Skin: Skin is warm and dry.  Psychiatric: He has a normal mood and affect. His behavior is normal. Judgment and thought content normal.       Assessment & Plan:     Routine Health Maintenance and Physical Exam  Exercise Activities and Dietary recommendations Goals    None      Immunization History  Administered Date(s) Administered  . Tdap 06/23/2010    Health Maintenance  Topic Date Due  . Hepatitis C Screening  05-30-1961  . HIV Screening  01/12/1976  . COLONOSCOPY  01/12/2011  . INFLUENZA VACCINE  02/11/2015  . TETANUS/TDAP  06/23/2020      Discussed health benefits of physical activity, and encouraged him to engage in regular exercise appropriate for his age and condition.   RTC 1 year. I have done the exam and reviewed the above chart and it is accurate to the best of my knowledge.  --------------------------------------------------------------------

## 2015-07-04 LAB — URINALYSIS, MICROSCOPIC ONLY
Bacteria, UA: NONE SEEN
CASTS: NONE SEEN /LPF
Epithelial Cells (non renal): NONE SEEN /hpf (ref 0–10)

## 2015-07-04 LAB — PLEASE NOTE

## 2015-07-07 ENCOUNTER — Other Ambulatory Visit: Payer: Self-pay | Admitting: Family Medicine

## 2015-07-20 LAB — LIPID PANEL
CHOLESTEROL TOTAL: 217 mg/dL — AB (ref 100–199)
Chol/HDL Ratio: 5.3 ratio units — ABNORMAL HIGH (ref 0.0–5.0)
HDL: 41 mg/dL (ref >39)
LDL CALC: 158 mg/dL — AB (ref 0–99)
Triglycerides: 91 mg/dL (ref 0–149)
VLDL CHOLESTEROL CAL: 18 mg/dL (ref 5–40)

## 2015-07-20 LAB — CBC WITH DIFFERENTIAL/PLATELET
BASOS: 0 %
Basophils Absolute: 0 10*3/uL (ref 0.0–0.2)
EOS (ABSOLUTE): 0.2 10*3/uL (ref 0.0–0.4)
EOS: 3 %
HEMATOCRIT: 46.5 % (ref 37.5–51.0)
HEMOGLOBIN: 16.4 g/dL (ref 12.6–17.7)
IMMATURE GRANS (ABS): 0.1 10*3/uL (ref 0.0–0.1)
Immature Granulocytes: 1 %
LYMPHS ABS: 1.4 10*3/uL (ref 0.7–3.1)
Lymphs: 16 %
MCH: 30.7 pg (ref 26.6–33.0)
MCHC: 35.3 g/dL (ref 31.5–35.7)
MCV: 87 fL (ref 79–97)
MONOCYTES: 15 %
Monocytes Absolute: 1.4 10*3/uL — ABNORMAL HIGH (ref 0.1–0.9)
NEUTROS ABS: 6 10*3/uL (ref 1.4–7.0)
Neutrophils: 65 %
Platelets: 362 10*3/uL (ref 150–379)
RBC: 5.34 x10E6/uL (ref 4.14–5.80)
RDW: 12.6 % (ref 12.3–15.4)
WBC: 9.2 10*3/uL (ref 3.4–10.8)

## 2015-07-20 LAB — COMPREHENSIVE METABOLIC PANEL
ALBUMIN: 4.4 g/dL (ref 3.5–5.5)
ALK PHOS: 43 IU/L (ref 39–117)
ALT: 30 IU/L (ref 0–44)
AST: 20 IU/L (ref 0–40)
Albumin/Globulin Ratio: 1.6 (ref 1.1–2.5)
BILIRUBIN TOTAL: 0.3 mg/dL (ref 0.0–1.2)
BUN / CREAT RATIO: 14 (ref 9–20)
BUN: 16 mg/dL (ref 6–24)
CO2: 25 mmol/L (ref 18–29)
CREATININE: 1.14 mg/dL (ref 0.76–1.27)
Calcium: 9.7 mg/dL (ref 8.7–10.2)
Chloride: 100 mmol/L (ref 96–106)
GFR calc non Af Amer: 73 mL/min/{1.73_m2} (ref >59)
GFR, EST AFRICAN AMERICAN: 84 mL/min/{1.73_m2} (ref >59)
GLOBULIN, TOTAL: 2.7 g/dL (ref 1.5–4.5)
GLUCOSE: 89 mg/dL (ref 65–99)
Potassium: 4.8 mmol/L (ref 3.5–5.2)
SODIUM: 141 mmol/L (ref 134–144)
TOTAL PROTEIN: 7.1 g/dL (ref 6.0–8.5)

## 2015-07-20 LAB — TSH: TSH: 2.63 u[IU]/mL (ref 0.450–4.500)

## 2015-07-20 LAB — PSA: PROSTATE SPECIFIC AG, SERUM: 3.1 ng/mL (ref 0.0–4.0)

## 2015-07-23 ENCOUNTER — Telehealth: Payer: Self-pay

## 2015-07-23 NOTE — Telephone Encounter (Signed)
-----   Message from Jerrol Banana., MD sent at 07/21/2015  8:21 AM EST ----- D and E as discussed fo high cholesterol

## 2015-07-23 NOTE — Telephone Encounter (Signed)
Left message to call back  

## 2015-07-30 NOTE — Telephone Encounter (Signed)
Advised patient as below.  

## 2015-07-30 NOTE — Telephone Encounter (Signed)
[id number] 

## 2016-06-23 ENCOUNTER — Other Ambulatory Visit: Payer: Self-pay | Admitting: Family Medicine

## 2016-07-09 ENCOUNTER — Encounter: Payer: 59 | Admitting: Family Medicine

## 2016-07-15 ENCOUNTER — Encounter: Payer: 59 | Admitting: Family Medicine

## 2016-07-29 ENCOUNTER — Encounter: Payer: 59 | Admitting: Family Medicine

## 2016-09-08 ENCOUNTER — Encounter: Payer: Self-pay | Admitting: Family Medicine

## 2016-09-08 ENCOUNTER — Ambulatory Visit (INDEPENDENT_AMBULATORY_CARE_PROVIDER_SITE_OTHER): Payer: 59 | Admitting: Family Medicine

## 2016-09-08 VITALS — BP 106/72 | HR 62 | Temp 98.6°F | Resp 16 | Ht 69.75 in | Wt 212.0 lb

## 2016-09-08 DIAGNOSIS — M25562 Pain in left knee: Secondary | ICD-10-CM | POA: Diagnosis not present

## 2016-09-08 DIAGNOSIS — Z0000 Encounter for general adult medical examination without abnormal findings: Secondary | ICD-10-CM

## 2016-09-08 DIAGNOSIS — Z1211 Encounter for screening for malignant neoplasm of colon: Secondary | ICD-10-CM | POA: Diagnosis not present

## 2016-09-08 DIAGNOSIS — M25561 Pain in right knee: Secondary | ICD-10-CM

## 2016-09-08 DIAGNOSIS — N451 Epididymitis: Secondary | ICD-10-CM

## 2016-09-08 DIAGNOSIS — K219 Gastro-esophageal reflux disease without esophagitis: Secondary | ICD-10-CM | POA: Diagnosis not present

## 2016-09-08 DIAGNOSIS — E7800 Pure hypercholesterolemia, unspecified: Secondary | ICD-10-CM | POA: Diagnosis not present

## 2016-09-08 DIAGNOSIS — Z125 Encounter for screening for malignant neoplasm of prostate: Secondary | ICD-10-CM | POA: Diagnosis not present

## 2016-09-08 LAB — POCT URINALYSIS DIPSTICK
Bilirubin, UA: NEGATIVE
Blood, UA: NEGATIVE
Glucose, UA: NEGATIVE
Ketones, UA: NEGATIVE
LEUKOCYTES UA: NEGATIVE
NITRITE UA: NEGATIVE
PH UA: 6
PROTEIN UA: NEGATIVE
Spec Grav, UA: 1.015
UROBILINOGEN UA: 0.2

## 2016-09-08 LAB — IFOBT (OCCULT BLOOD): IFOBT: NEGATIVE

## 2016-09-08 MED ORDER — NAPROXEN 500 MG PO TABS: 500.0000 mg | ORAL_TABLET | Freq: Two times a day (BID) | ORAL | 0 refills | Status: DC

## 2016-09-08 MED ORDER — SULFAMETHOXAZOLE-TRIMETHOPRIM 800-160 MG PO TABS: 1.0000 | ORAL_TABLET | Freq: Two times a day (BID) | ORAL | 0 refills | Status: DC

## 2016-09-08 NOTE — Progress Notes (Signed)
Patient: Christopher Parsons, Male    DOB: 24-Jul-1960, 56 y.o.   MRN: [id number] Visit Date: 09/08/2016  Today's Provider: Wilhemena Durie, MD   Chief Complaint  Patient presents with  . Annual Exam   Subjective:  Christopher Parsons is a 56 y.o. male who presents today for health maintenance and complete physical. He feels fairly well. He reports exercising not right now due to knee issues. He reports he is sleeping well. Immunization History  Administered Date(s) Administered  . Tdap 06/23/2010   Last colonoscopy was 09/03/11 hyperplastic polyps. Depression screen PHQ 2/9 09/08/2016  Decreased Interest 1  Down, Depressed, Hopeless 0  PHQ - 2 Score 1  Altered sleeping 0  Tired, decreased energy 2  Change in appetite 0  Feeling bad or failure about yourself  0  Trouble concentrating 1  Moving slowly or fidgety/restless 0  Suicidal thoughts 0  PHQ-9 Score 4   Review of Systems  Constitutional: Negative.   HENT: Negative.   Eyes: Negative.   Respiratory: Negative.   Cardiovascular: Negative.   Gastrointestinal: Negative.   Endocrine: Negative.   Genitourinary: Positive for dysuria and testicular pain.  Musculoskeletal: Positive for arthralgias.  Skin: Negative.   Allergic/Immunologic: Negative.   Neurological: Negative.   Hematological: Negative.   Psychiatric/Behavioral: The patient is nervous/anxious.     Social History   Social History  . Marital status: Married    Spouse name: N/A  . Number of children: 3  . Years of education: 12   Occupational History  . Not on file.   Social History Main Topics  . Smoking status: Never Smoker  . Smokeless tobacco: Never Used  . Alcohol use 3.0 oz/week    5 Shots of liquor per week  . Drug use: No  . Sexual activity: Yes    Birth control/ protection: None     Comment: vasectomy   Other Topics Concern  . Not on file   Social History Narrative  . No narrative on file    Patient Active Problem List   Diagnosis  Date Noted  . Acquired hypothyroidism 06/27/2015  . Fracture of skull ([place]) 06/27/2015  . Hypercholesterolemia 06/27/2015  . Adult hypothyroidism 06/27/2015  . Snores 06/27/2015  . Problems with swallowing and mastication   . Swallowing difficulty   . Stricture and stenosis of esophagus   . Gastritis   . Vitamin D deficiency 11/18/2009  . Acid reflux 05/23/2009  . Cardiac murmur 05/23/2009    Past Surgical History:  Procedure Laterality Date  . COLONOSCOPY    . ESOPHAGOGASTRODUODENOSCOPY    . ESOPHAGOGASTRODUODENOSCOPY (EGD) WITH PROPOFOL N/A 01/18/2015   Procedure: ESOPHAGOGASTRODUODENOSCOPY (EGD) WITH PROPOFOL with dialtion;  Surgeon: [name], MD;  Location: [place];  Service: Endoscopy;  Laterality: N/A;  . ESOPHAGOGASTRODUODENOSCOPY (EGD) WITH PROPOFOL N/A 05/17/2015   Procedure: ESOPHAGOGASTRODUODENOSCOPY (EGD) WITH PROPOFOL, WITH DIALATION;  Surgeon: [name], MD;  Location: [place];  Service: Endoscopy;  Laterality: N/A;  . GANGLION CYST EXCISION Left    wrist  . TONSILLECTOMY    . VASECTOMY      His family history includes Cancer (age of onset: 23) in his father; Healthy in his brother, brother, and sister; Hyperlipidemia in his mother.     Outpatient Encounter Prescriptions as of 09/08/2016  Medication Sig  . aspirin 81 MG tablet Take 81 mg by mouth daily.  . cholecalciferol (VITAMIN D) 1000 units tablet Take 1,000 Units by mouth daily.  Marland Kitchen omeprazole (PRILOSEC) 40  MG capsule TAKE 1 CAPSULE BY MOUTH  EVERY DAY  . [DISCONTINUED] levothyroxine (SYNTHROID, LEVOTHROID) 50 MCG tablet Take 1 tablet by mouth  daily  . [DISCONTINUED] Vitamin D, Ergocalciferol, (DRISDOL) 50000 UNITS CAPS capsule Take 1 capsule by mouth once a week.   No facility-administered encounter medications on file as of 09/08/2016.     Patient Care Team: Jerrol Banana., MD as PCP - General (Family Medicine)      Objective:   Vitals:  Vitals:   09/08/16 0948   BP: 106/72  Pulse: 62  Resp: 16  Temp: 98.6 F (37 C)  Weight: 212 lb (96.2 kg)  Height: 5' 9.75" (1.772 m)    Physical Exam  Constitutional: He is oriented to person, place, and time. He appears well-developed and well-nourished.  HENT:  Head: Normocephalic and atraumatic.  Right Ear: External ear normal.  Left Ear: External ear normal.  Mouth/Throat: Oropharynx is clear and moist.  Eyes: Conjunctivae are normal. Pupils are equal, round, and reactive to light.  Neck: Normal range of motion. Neck supple.  Cardiovascular: Normal rate, regular rhythm, normal heart sounds and intact distal pulses.   No murmur heard. Pulmonary/Chest: Effort normal and breath sounds normal. No respiratory distress. He has no wheezes.  Abdominal: Soft. There is no tenderness. There is no rebound.  Genitourinary: Rectum normal, prostate normal and penis normal. Rectal exam shows guaiac negative stool. No penile tenderness.  Genitourinary Comments: Mildly and tender of brown left epididymis.  Musculoskeletal: He exhibits no edema or tenderness.  Neurological: He is alert and oriented to person, place, and time.  Skin: Skin is warm and dry. No rash noted. No erythema.  Psychiatric: He has a normal mood and affect. His behavior is normal. Judgment and thought content normal.     Depression Screen PHQ 2/9 Scores 09/08/2016  PHQ - 2 Score 1  PHQ- 9 Score 4   Assessment & Plan:  1. Annual physical exam - POCT Urinalysis Dipstick Epworth score today 8. 2. Prostate cancer screening  3. Colon cancer screening Negative Oclyte - IFOBT POC (occult bld, rslt in office)  4. Gastroesophageal reflux disease, esophagitis presence not specified Stable.  5. 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 Start naproxen. Follow as needed. OA vs patellar tendon. - DG Knee Complete 4 Views Left; Future - DG Knee Complete 4 Views Right; Future  6. Hypercholesterolemia Check  routine labs today. 7. Epididymitis Will treat as this for symptoms patient is having. Start Septra DS and follow as needed. UA normal in the office. HPI, Exam and A&P transcribed under direction and in the presence of Miguel Aschoff, MD.  Discussed health benefits of physical activity, and encouraged him to engage in regular exercise appropriate for his age and condition.  I have done the exam and reviewed the chart and it is accurate to the best of my knowledge. Development worker, community has been used and  any errors in dictation or transcription are unintentional. Miguel Aschoff M.D. [facility]

## 2016-09-12 LAB — COMPREHENSIVE METABOLIC PANEL
ALBUMIN: 4.4 g/dL (ref 3.5–5.5)
ALK PHOS: 41 IU/L (ref 39–117)
ALT: 34 IU/L (ref 0–44)
AST: 22 IU/L (ref 0–40)
Albumin/Globulin Ratio: 1.5 (ref 1.2–2.2)
BILIRUBIN TOTAL: 0.4 mg/dL (ref 0.0–1.2)
BUN / CREAT RATIO: 11 (ref 9–20)
BUN: 15 mg/dL (ref 6–24)
CHLORIDE: 99 mmol/L (ref 96–106)
CO2: 22 mmol/L (ref 18–29)
Calcium: 9.7 mg/dL (ref 8.7–10.2)
Creatinine, Ser: 1.39 mg/dL — ABNORMAL HIGH (ref 0.76–1.27)
GFR calc Af Amer: 65 mL/min/{1.73_m2} (ref >59)
GFR calc non Af Amer: 57 mL/min/{1.73_m2} — ABNORMAL LOW (ref >59)
GLOBULIN, TOTAL: 2.9 g/dL (ref 1.5–4.5)
GLUCOSE: 89 mg/dL (ref 65–99)
Potassium: 4.9 mmol/L (ref 3.5–5.2)
SODIUM: 140 mmol/L (ref 134–144)
Total Protein: 7.3 g/dL (ref 6.0–8.5)

## 2016-09-12 LAB — CBC WITH DIFFERENTIAL/PLATELET
BASOS ABS: 0.1 10*3/uL (ref 0.0–0.2)
Basos: 1 %
EOS (ABSOLUTE): 0.3 10*3/uL (ref 0.0–0.4)
Eos: 3 %
HEMOGLOBIN: 16.1 g/dL (ref 13.0–17.7)
Hematocrit: 46.7 % (ref 37.5–51.0)
Immature Grans (Abs): 0 10*3/uL (ref 0.0–0.1)
Immature Granulocytes: 1 %
LYMPHS ABS: 1 10*3/uL (ref 0.7–3.1)
Lymphs: 13 %
MCH: 30.7 pg (ref 26.6–33.0)
MCHC: 34.5 g/dL (ref 31.5–35.7)
MCV: 89 fL (ref 79–97)
MONOCYTES: 16 %
MONOS ABS: 1.2 10*3/uL — AB (ref 0.1–0.9)
Neutrophils Absolute: 5.2 10*3/uL (ref 1.4–7.0)
Neutrophils: 66 %
Platelets: 296 10*3/uL (ref 150–379)
RBC: 5.25 x10E6/uL (ref 4.14–5.80)
RDW: 13.2 % (ref 12.3–15.4)
WBC: 7.7 10*3/uL (ref 3.4–10.8)

## 2016-09-12 LAB — LIPID PANEL WITH LDL/HDL RATIO
CHOLESTEROL TOTAL: 220 mg/dL — AB (ref 100–199)
HDL: 40 mg/dL (ref >39)
LDL Calculated: 156 mg/dL — ABNORMAL HIGH (ref 0–99)
LDl/HDL Ratio: 3.9 ratio units — ABNORMAL HIGH (ref 0.0–3.6)
TRIGLYCERIDES: 121 mg/dL (ref 0–149)
VLDL Cholesterol Cal: 24 mg/dL (ref 5–40)

## 2016-09-12 LAB — PSA: PROSTATE SPECIFIC AG, SERUM: 3.7 ng/mL (ref 0.0–4.0)

## 2016-09-12 LAB — TSH: TSH: 4.16 u[IU]/mL (ref 0.450–4.500)

## 2016-09-20 ENCOUNTER — Encounter: Payer: Self-pay | Admitting: Family Medicine

## 2016-09-21 ENCOUNTER — Telehealth: Payer: Self-pay

## 2016-09-21 DIAGNOSIS — R0681 Apnea, not elsewhere classified: Secondary | ICD-10-CM

## 2016-09-21 NOTE — Telephone Encounter (Signed)
Dr Rosanna Randy,    During my recent visit we discussed Sleep Apnea and you asked me to ask [facility] if I stop breathing at night. She says I do stop breathing at night on average 2 nights a week and on some nights multiple times in a night.     Based on this I will assume you will want to do a sleep test.     Thanks  Christopher Parsons   This was emailed from patient

## 2016-09-24 ENCOUNTER — Telehealth: Payer: Self-pay | Admitting: Family Medicine

## 2016-09-24 DIAGNOSIS — M25562 Pain in left knee: Principal | ICD-10-CM

## 2016-09-24 DIAGNOSIS — M25561 Pain in right knee: Secondary | ICD-10-CM

## 2016-09-24 NOTE — Telephone Encounter (Signed)
Order put in-aa 

## 2016-09-24 NOTE — Telephone Encounter (Signed)
Ok to refer to ortho 

## 2016-09-24 NOTE — Telephone Encounter (Signed)
Order for sleep study put in. Patient advised through [facility]

## 2016-09-24 NOTE — Telephone Encounter (Signed)
Please review, knee pain was discussed in recent visit in February 2018-aa

## 2016-09-24 NOTE — Telephone Encounter (Signed)
Pt's wife Hope called requesting referral to [facility] for knee pain.Late afternoon is best and does not want appointment on a Friday.Call back # is [phone]

## 2016-09-24 NOTE — Telephone Encounter (Signed)
Christopher Parsons was 8--ok to try to order home sleep study.

## 2016-09-29 ENCOUNTER — Telehealth: Payer: Self-pay | Admitting: Family Medicine

## 2016-09-29 NOTE — Telephone Encounter (Signed)
Order for baseline PSG faxed to [facility] °

## 2016-10-27 ENCOUNTER — Encounter: Payer: Self-pay | Admitting: Family Medicine

## 2016-11-09 ENCOUNTER — Telehealth: Payer: Self-pay

## 2016-11-09 ENCOUNTER — Encounter: Payer: Self-pay | Admitting: Family Medicine

## 2016-11-09 NOTE — Telephone Encounter (Signed)
Since the Sleep Study that you ordered was canceled ~2 weeks ago wanted to understand options/next steps.  Above was received from the [place] [place]

## 2016-11-11 NOTE — Telephone Encounter (Signed)
Can we get home study or in facility or are both refused?

## 2016-11-11 NOTE — Telephone Encounter (Signed)
In lab study was denied by insurance.Order for home sleep study  [facility]) sent back to Dr Rosanna Randy to sign 11/11/16

## 2016-11-12 NOTE — Telephone Encounter (Signed)
Order for home sleep study faxed to [facility] °

## 2016-12-09 ENCOUNTER — Telehealth: Payer: Self-pay

## 2016-12-09 ENCOUNTER — Encounter: Payer: Self-pay | Admitting: Family Medicine

## 2016-12-09 NOTE — Telephone Encounter (Signed)
I completed the home sleep test and returned it 2 weeks ago. Have you received the results? what are the next steps?-aa This was sent through [facility]

## 2016-12-10 ENCOUNTER — Telehealth: Payer: Self-pay | Admitting: Emergency Medicine

## 2016-12-10 NOTE — Telephone Encounter (Signed)
Pt is aware you are out of the office but would like you to review his home sleep study and let him know what the next step is. He sent an email as below.   Received a phone call from the home sleep testing company this afternoon. They stated that the test "failed" and that I could retest if I wanted.  Have you received any information from them? What are your recommendations for next steps?

## 2016-12-11 NOTE — Telephone Encounter (Signed)
The report about the test fail is in the media area of patient's chart. Thank you-aa

## 2016-12-11 NOTE — Telephone Encounter (Signed)
Repeat order for home test. Sorry I did not see this.

## 2016-12-11 NOTE — Telephone Encounter (Signed)
I will reviewwht I have here but I do not remember seeing [facility].

## 2016-12-28 ENCOUNTER — Encounter: Payer: Self-pay | Admitting: Family Medicine

## 2016-12-30 ENCOUNTER — Other Ambulatory Visit: Payer: Self-pay

## 2016-12-30 DIAGNOSIS — R899 Unspecified abnormal finding in specimens from other organs, systems and tissues: Secondary | ICD-10-CM

## 2017-01-07 ENCOUNTER — Encounter: Payer: Self-pay | Admitting: Family Medicine

## 2017-01-07 ENCOUNTER — Other Ambulatory Visit: Payer: Self-pay

## 2017-01-07 DIAGNOSIS — R899 Unspecified abnormal finding in specimens from other organs, systems and tissues: Secondary | ICD-10-CM

## 2017-01-12 LAB — RENAL FUNCTION PANEL
Albumin: 4.1 g/dL (ref 3.5–5.5)
BUN / CREAT RATIO: 12 (ref 9–20)
BUN: 13 mg/dL (ref 6–24)
CALCIUM: 9.2 mg/dL (ref 8.7–10.2)
CO2: 26 mmol/L (ref 20–29)
Chloride: 102 mmol/L (ref 96–106)
Creatinine, Ser: 1.11 mg/dL (ref 0.76–1.27)
GFR calc Af Amer: 85 mL/min/{1.73_m2} (ref >59)
GFR calc non Af Amer: 74 mL/min/{1.73_m2} (ref >59)
GLUCOSE: 90 mg/dL (ref 65–99)
PHOSPHORUS: 3 mg/dL (ref 2.5–4.5)
POTASSIUM: 4.6 mmol/L (ref 3.5–5.2)
SODIUM: 141 mmol/L (ref 134–144)

## 2017-01-14 NOTE — Progress Notes (Signed)
Advised  ED 

## 2017-01-18 ENCOUNTER — Encounter: Payer: Self-pay | Admitting: Family Medicine

## 2017-02-22 ENCOUNTER — Telehealth: Payer: Self-pay

## 2017-02-22 ENCOUNTER — Encounter: Payer: Self-pay | Admitting: Family Medicine

## 2017-02-22 DIAGNOSIS — R29818 Other symptoms and signs involving the nervous system: Secondary | ICD-10-CM

## 2017-02-22 DIAGNOSIS — G479 Sleep disorder, unspecified: Secondary | ICD-10-CM

## 2017-02-22 NOTE — Telephone Encounter (Signed)
Subject: Visit Follow-Up Question               I contacted the home sleep company last week. They stated the 2nd test failed and they were trying to figure out why. There was no time frame on any kind of response from them. What is our next step?

## 2017-02-22 NOTE — Telephone Encounter (Signed)
Lets refer to Dr Norlene Duel neurologist Rachelle Hora specialist with [facility]. I do not know what else we can do.

## 2017-02-23 NOTE — Telephone Encounter (Signed)
Patient advised through mychart. Order for referral put in-aa

## 2017-03-29 ENCOUNTER — Ambulatory Visit (INDEPENDENT_AMBULATORY_CARE_PROVIDER_SITE_OTHER): Payer: 59 | Admitting: Neurology

## 2017-03-29 ENCOUNTER — Encounter: Payer: Self-pay | Admitting: Neurology

## 2017-03-29 VITALS — BP 123/83 | HR 65 | Ht 70.0 in | Wt 211.0 lb

## 2017-03-29 DIAGNOSIS — M2619 Other specified anomalies of jaw-cranial base relationship: Secondary | ICD-10-CM

## 2017-03-29 DIAGNOSIS — R0981 Nasal congestion: Secondary | ICD-10-CM

## 2017-03-29 DIAGNOSIS — G4710 Hypersomnia, unspecified: Secondary | ICD-10-CM | POA: Diagnosis not present

## 2017-03-29 DIAGNOSIS — G4730 Sleep apnea, unspecified: Secondary | ICD-10-CM | POA: Diagnosis not present

## 2017-03-29 DIAGNOSIS — R0683 Snoring: Secondary | ICD-10-CM

## 2017-03-29 MED ORDER — MOMETASONE FUROATE 50 MCG/ACT NA SUSP: 2.0000 | Freq: Every day | NASAL | 12 refills | Status: DC
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
## 2017-03-29 NOTE — Patient Instructions (Signed)

## 2017-05-23 ENCOUNTER — Ambulatory Visit (INDEPENDENT_AMBULATORY_CARE_PROVIDER_SITE_OTHER): Payer: 59 | Admitting: Neurology

## 2017-05-23 DIAGNOSIS — G4710 Hypersomnia, unspecified: Secondary | ICD-10-CM

## 2017-05-23 DIAGNOSIS — R0683 Snoring: Secondary | ICD-10-CM | POA: Diagnosis not present

## 2017-05-23 DIAGNOSIS — M2619 Other specified anomalies of jaw-cranial base relationship: Secondary | ICD-10-CM

## 2017-05-23 DIAGNOSIS — R0981 Nasal congestion: Secondary | ICD-10-CM

## 2017-05-23 DIAGNOSIS — G4730 Sleep apnea, unspecified: Secondary | ICD-10-CM

## 2017-05-25 ENCOUNTER — Other Ambulatory Visit: Payer: Self-pay | Admitting: Family Medicine

## 2017-05-28 ENCOUNTER — Other Ambulatory Visit: Payer: Self-pay | Admitting: Neurology

## 2017-05-28 DIAGNOSIS — R0981 Nasal congestion: Secondary | ICD-10-CM

## 2017-05-28 DIAGNOSIS — M2619 Other specified anomalies of jaw-cranial base relationship: Secondary | ICD-10-CM

## 2017-05-28 DIAGNOSIS — G4710 Hypersomnia, unspecified: Secondary | ICD-10-CM

## 2017-05-28 DIAGNOSIS — R011 Cardiac murmur, unspecified: Secondary | ICD-10-CM

## 2017-05-28 DIAGNOSIS — G4730 Sleep apnea, unspecified: Principal | ICD-10-CM

## 2017-05-28 NOTE — Procedures (Signed)
PATIENT'S NAME:  Christopher Parsons, Christopher Parsons DOB:      1960-07-31      MRN#:    [id number]     DATE OF RECORDING: 05/23/2017 REFERRING M.D.:  Delfino Lovett Cranford Mon, M.D. Study Performed:   Baseline Polysomnogram HISTORY:   Christopher Parsons is a 56 year old male patient with GERD, hypothyroidism, Mitral valve prolapse and presents with reports by his wife, Christopher Parsons, who witnessed him to have snoring and apnea. He has fragmented sleep, nasal congestion and is overweight. He presented after two HST and one ONO were non-conclusive for apnea.   The patient endorsed the Epworth Sleepiness Scale at 12 points.  The patient's weight 211 pounds with a height of 70 (inches), resulting in a BMI of 30.3 kg/m2.The patient's neck circumference measured 12.3 inches.  CURRENT MEDICATIONS: ASA 81mg , Prilosec, nasal spray   PROCEDURE:  This is a multichannel digital polysomnogram utilizing the Somnostar 11.2 system.  Electrodes and sensors were applied and monitored per AASM Specifications.   EEG, EOG, Chin and Limb EMG, were sampled at 200 Hz.  ECG, Snore and Nasal Pressure, Thermal Airflow, Respiratory Effort, CPAP Flow and Pressure, Oximetry was sampled at 50 Hz. Digital video and audio were recorded.      BASELINE STUDY : Lights Out was at 22:40 and Lights On at 05:00.  Total recording time (TRT) was 381 minutes, with a total sleep time (TST) of 344.5 minutes.   The patient's sleep latency was 27.5 minutes.  REM latency was 102 minutes.  The sleep efficiency was 90.4 %.     SLEEP ARCHITECTURE: WASO (Wake after sleep onset) was 9 minutes.  There were 23 minutes in Stage N1, 213.5 minutes Stage N2, 39 minutes Stage N3 and 69 minutes in Stage REM.  The percentage of Stage N1 was 6.7%, Stage N2 was 62.%, Stage N3 was 11.3% and Stage R (REM sleep) was 20.%.   RESPIRATORY ANALYSIS:  There were a total of 75 respiratory events:  1 obstructive apnea, 74 hypopneas with 0 respiratory event related arousals (RERAs).The total  APNEA/HYPOPNEA INDEX (AHI) was 13.1/hour and the total RESPIRATORY DISTURBANCE INDEX was 13.1 /hour.  17 events occurred in REM sleep and 114 events in NREM. The REM AHI was 14.8 /hour, versus a non-REM AHI of 12.6. The patient spent 62.5 minutes of total sleep time in the supine position and 282 minutes in non-supine. The supine AHI was 37.5 versus a non-supine AHI of 7.7.  OXYGEN SATURATION & C02:  The Wake baseline 02 saturation was 96%, with the lowest being 83%. Time spent below 89% saturation equaled 9 minutes.   PERIODIC LIMB MOVEMENTS:  The patient had a total of 107 Periodic Limb Movements.  The Periodic Limb Movement (PLM) index was 18.6 and the PLM Arousal index was 1.6/hour. The arousals were noted as: 34 were spontaneous, 9 were associated with PLMs, and 32 were associated with respiratory events.  Audio and video analysis did not show any abnormal or unusual movements, behaviors, phonations or vocalizations.   No nocturia. Mild Snoring was noted. EKG was in keeping with normal sinus rhythm (NSR).  Post-study, the patient indicated that sleep was the same as usual.    IMPRESSION:  1) This patient has mild Obstructive Sleep Apnea (AHI 13.1), accentuated by supine sleep (37.5/hr.) and during REM sleep (AHI of 14.8/hr.).  2) Mild PLMs were clustered during one hour of NREM sleep on the patient's left side. This seems to be position dependent.  3) Sleep fragmentation improved  during the second half of the night.   RECOMMENDATIONS:  1. REM dependent apnea responds usually incompletely to dental devices. Advise full-night, attended, CPAP titration study to optimize therapy.   2. 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  Consider treating the PLMS primarily. Positional therapy is advised. 3. Advise to lose weight, diet and exercise if not contraindicated (BMI 30). 4. Further information regarding OSA may be obtained from [employer] (www.sleepfoundation.org) or American Sleep Apnea Association (www.sleepapnea.org). 5. Avoid caffeine-containing beverages and chocolate. 6. A follow up appointment will be scheduled in the Sleep Clinic at [facility]. The referring provider will be notified of the results.     I certify that I have reviewed the entire raw data recording prior to the issuance of this report in accordance with the Standards of Accreditation of the American Academy of Sleep Medicine (AASM)   Larey Seat, MD   05-28-2017 Diplomat, American Board of Psychiatry and Neurology  Diplomat, American Board of [place] Director, [facility] [employer]

## 2017-06-01 ENCOUNTER — Telehealth: Payer: Self-pay | Admitting: Neurology

## 2017-06-01 NOTE — Telephone Encounter (Signed)
I called pt. I advised pt that Dr. Brett Fairy reviewed their sleep study results and found that pt has sleep apnea. Dr. Brett Fairy recommends that pt come in for a CPAP titration sleep study here in the sleep lab. I have LVM with this information and explained that the patient can call back to discuss this further.

## 2017-06-01 NOTE — Telephone Encounter (Signed)
-----   Message from Larey Seat, MD sent at 05/28/2017  2:54 PM EST ----- :  1) This patient has mild Obstructive Sleep Apnea (AHI 13.1), accentuated by supine sleep (37.5/hr.) and during REM sleep (AHI of 14.8/hr.).  2) Mild PLMs were clustered during one hour of NREM sleep on the patient's left side. This seems to be position dependent.  3) Sleep fragmentation improved during the second half of the night.   RECOMMENDATIONS:  1. REM dependent apnea responds usually incompletely to dental devices. Advise CPAP titration study to optimize therapy.  I have ordered a CAP titration.

## 2017-06-07 ENCOUNTER — Ambulatory Visit (INDEPENDENT_AMBULATORY_CARE_PROVIDER_SITE_OTHER): Payer: 59 | Admitting: Neurology

## 2017-06-07 DIAGNOSIS — M2619 Other specified anomalies of jaw-cranial base relationship: Secondary | ICD-10-CM

## 2017-06-07 DIAGNOSIS — G4733 Obstructive sleep apnea (adult) (pediatric): Secondary | ICD-10-CM

## 2017-06-07 DIAGNOSIS — G4730 Sleep apnea, unspecified: Secondary | ICD-10-CM

## 2017-06-07 DIAGNOSIS — R0981 Nasal congestion: Secondary | ICD-10-CM

## 2017-06-07 DIAGNOSIS — G4710 Hypersomnia, unspecified: Secondary | ICD-10-CM

## 2017-06-07 DIAGNOSIS — R011 Cardiac murmur, unspecified: Secondary | ICD-10-CM

## 2017-06-09 ENCOUNTER — Other Ambulatory Visit: Payer: Self-pay | Admitting: Neurology

## 2017-06-09 ENCOUNTER — Telehealth: Payer: Self-pay | Admitting: Neurology

## 2017-06-09 DIAGNOSIS — G4710 Hypersomnia, unspecified: Secondary | ICD-10-CM

## 2017-06-09 DIAGNOSIS — G4730 Sleep apnea, unspecified: Principal | ICD-10-CM

## 2017-06-09 DIAGNOSIS — R0981 Nasal congestion: Secondary | ICD-10-CM

## 2017-06-09 DIAGNOSIS — K222 Esophageal obstruction: Secondary | ICD-10-CM

## 2017-06-09 NOTE — Telephone Encounter (Signed)
I called pt. I advised pt that Dr. Dyanne Iha reviewed their sleep study results and found that pt has sleep apnea. Dr. Brett Fairy recommends that pt starts CPAP. I reviewed PAP compliance expectations with the pt. Pt is agreeable to starting a CPAP. I advised pt that an order will be sent to a DME, [facility], and [facility] will call the pt within about one week after they file with the pt's insurance. [facility] will show the pt how to use the machine, fit for masks, and troubleshoot the CPAP if needed. A follow up appt was made for insurance purposes with Cecille Rubin, NP on Sep 08, 2017 at 9:15 am. Pt verbalized understanding to arrive 15 minutes early and bring their CPAP. A letter with all of this information in it will be mailed to the pt as a reminder. I verified with the pt that the address we have on file is correct. Pt verbalized understanding of results. Pt had no questions at this time but was encouraged to call back if questions arise.

## 2017-06-09 NOTE — Telephone Encounter (Signed)
-----   Message from Larey Seat, MD sent at 06/09/2017  8:59 AM EST ----- Start CPAP 9 cm water pressure, heated  humidity and FFM. The patient was fitted with a ResMed AirFit F20  (Large) apparatus.

## 2017-06-09 NOTE — Procedures (Signed)
PATIENT'S NAME:  Christopher Parsons, Christopher Parsons DOB:      02-12-1961      MR#:    [id number]     DATE OF RECORDING: 06/07/2017 REFERRING M.D.:  Delfino Lovett Cranford Mon, MD Study Performed:   Titration to CPAP HISTORY:  Christopher Parsons is a 56 year old male patient with GERD, hypothyroidism, Mitral valve prolapse and presents with reports of snoring and apnea. He has fragmented sleep, nasal congestion and is overweight. He presented after two HST and one ONO were non-conclusive for apnea. A PSG from 05/23/17 revealed mild Obstructive Sleep Apnea (AHI 13.1), accentuated by supine sleep (37.5/hr.) and during REM sleep (AHI of 14.8/hr.). Mild PLMs were clustered during one hour of NREM sleep on the patient's left side - seems to be position dependent.  The patient endorsed the Epworth Sleepiness Scale at 12/24.  The patient's weight 212 pounds with a height of 70 (inches), resulting in a BMI of 30.3 kg/m2. The patient's neck circumference measured 17.25 inches.  CURRENT MEDICATIONS: ASA 81mg , Prilosec PROCEDURE:  This is a multichannel digital polysomnogram utilizing the SomnoStar 11.2 system.  Electrodes and sensors were applied and monitored per AASM Specifications.   EEG, EOG, Chin and Limb EMG, were sampled at 200 Hz.  ECG, Snore and Nasal Pressure, Thermal Airflow, Respiratory Effort, CPAP Flow and Pressure, Oximetry was sampled at 50 Hz. Digital video and audio were recorded.      CPAP was initiated at 5 cmH20 with heated humidity per AASM split night standards and pressure was advanced to 9cmH20 because of hypopneas, apneas and desaturations.  At a PAP pressure of 9 cmH20, there was a reduction of the AHI to 0.0 with improvement of obstructive sleep apnea. A FFM was sued per patient's request, the ResMed AirFit F20 in large size.    Lights Out was at 22:53 and Lights On at 05:01. Total recording time (TRT) was 369 minutes, with a total sleep time (TST) of 312.5 minutes. The patient's sleep latency was 20  minutes with 0 minutes of wake time after sleep onset. REM latency was 69.5 minutes.  The sleep efficiency was 84.7 %.    SLEEP ARCHITECTURE: WASO (Wake after sleep onset) was 36.5 minutes.  There were 14.5 minutes in Stage N1, 216 minutes Stage N2, 10 minutes Stage N3 and 72 minutes in Stage REM.  The percentage of Stage N1 was 4.6%, Stage N2 was 69.1%, Stage N3 was 3.2% and Stage R (REM sleep) was 23.%.   The arousals were noted as: 41 were spontaneous, 9 were associated with PLMs, and 28 were associated with respiratory events. Audio and video analysis did not show any abnormal or unusual movements, behaviors, phonations or vocalizations.  No nocturia noted, normal EKG.   RESPIRATORY ANALYSIS:  There was a total of 28 respiratory events: 0 apneas and 28 hypopneas with 0 respiratory event related arousals (RERAs). The total APNEA/HYPOPNEA INDEX  (AHI) was 5.4 /hour and the total RESPIRATORY DISTURBANCE INDEX was 5.4/hour-  2 events occurred in REM sleep and 26 events in NREM. The REM AHI was 1.7 /hour versus a non-REM AHI of 6.5 /hour.  The patient spent 95.5 minutes of total sleep time in the supine position and 217 minutes in non-supine. The supine AHI was 12.6, versus a non-supine AHI of 2.2.  OXYGEN SATURATION & C02:  The baseline 02 saturation was 94%, with the lowest being 91%. Time spent below 89% saturation equaled 0 minutes.  PERIODIC LIMB MOVEMENTS:   The patient had a  total of 62 Periodic Limb Movements. The Periodic Limb Movement (PLM) index was 11.9 and the PLM Arousal index was 1.7 /hour.  Post-study, the patient indicated that sleep was the same as usual.   DIAGNOSIS: Obstructive Sleep Apnea with REM and supine accentuation responded well to CPAP 9 cm H20 pressure with FFM.    PLANS/RECOMMENDATIONS: Start CPAP 9 cm water pressure, heated humidity and FFM. The patient was fitted with a ResMed AirFit F20 (Large) apparatus.  A follow up appointment will be scheduled in the Sleep  Clinic at [facility].   Please call [phone] with any questions.     I certify that I have reviewed the entire raw data recording prior to the issuance of this report in accordance with the Standards of Accreditation of the American Academy of Sleep Medicine (AASM)    Larey Seat, M.D.    06-09-2017  Diplomat, American Board of Psychiatry and Neurology  Diplomat, [place] of Sleep Medicine Medical Director, [place] [facility]

## 2017-06-28 ENCOUNTER — Encounter: Payer: Self-pay | Admitting: Neurology

## 2017-09-07 ENCOUNTER — Encounter: Payer: Self-pay | Admitting: Neurology
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
## 2017-09-08 ENCOUNTER — Ambulatory Visit: Payer: Managed Care, Other (non HMO) | Admitting: Nurse Practitioner

## 2017-09-08 ENCOUNTER — Encounter: Payer: Self-pay | Admitting: Nurse Practitioner

## 2017-09-08 VITALS — BP 118/77 | HR 65 | Ht 70.0 in | Wt 214.2 lb

## 2017-09-08 DIAGNOSIS — G4730 Sleep apnea, unspecified: Secondary | ICD-10-CM

## 2017-09-08 DIAGNOSIS — G4733 Obstructive sleep apnea (adult) (pediatric): Secondary | ICD-10-CM | POA: Diagnosis not present

## 2017-09-08 DIAGNOSIS — G4710 Hypersomnia, unspecified: Secondary | ICD-10-CM | POA: Diagnosis not present

## 2017-09-08 DIAGNOSIS — Z9989 Dependence on other enabling machines and devices: Secondary | ICD-10-CM | POA: Diagnosis not present

## 2017-09-08 NOTE — Patient Instructions (Signed)
CPAP compliance 87% greater than 4 hours for 26 days Continue same settings except increased pressure to 10 cm Follow-up in 3 months for repeat compliance

## 2017-09-13 ENCOUNTER — Encounter: Payer: 59 | Admitting: Family Medicine

## 2017-09-15 ENCOUNTER — Ambulatory Visit (INDEPENDENT_AMBULATORY_CARE_PROVIDER_SITE_OTHER): Payer: Managed Care, Other (non HMO) | Admitting: Family Medicine

## 2017-09-15 ENCOUNTER — Encounter: Payer: Self-pay | Admitting: Family Medicine

## 2017-09-15 VITALS — BP 122/76 | HR 62 | Temp 98.0°F | Resp 14 | Ht 70.0 in | Wt 217.0 lb

## 2017-09-15 DIAGNOSIS — Z1211 Encounter for screening for malignant neoplasm of colon: Secondary | ICD-10-CM

## 2017-09-15 DIAGNOSIS — Z0000 Encounter for general adult medical examination without abnormal findings: Secondary | ICD-10-CM | POA: Diagnosis not present

## 2017-09-15 LAB — POCT URINALYSIS DIPSTICK
BILIRUBIN UA: NEGATIVE
Glucose, UA: NEGATIVE
KETONES UA: NEGATIVE
Leukocytes, UA: NEGATIVE
Nitrite, UA: NEGATIVE
PH UA: 6 (ref 5.0–8.0)
Protein, UA: NEGATIVE
RBC UA: NEGATIVE
SPEC GRAV UA: 1.015 (ref 1.010–1.025)
UROBILINOGEN UA: 0.2 U/dL

## 2017-09-15 LAB — IFOBT (OCCULT BLOOD): IFOBT: NEGATIVE

## 2017-09-15 NOTE — Progress Notes (Signed)
Patient: Christopher Parsons, Male    DOB: 16-Aug-1960, 57 y.o.   MRN: [id number] Visit Date: 09/15/2017  Today's Provider: Wilhemena Durie, MD   Chief Complaint  Patient presents with  . Annual Exam   Subjective:    Annual physical exam Christopher Parsons is a 57 y.o. male who presents today for health maintenance and complete physical. He feels well. He reports exercising occasionally. He reports he is sleeping well, he recently started using a CPAP and is dealing with it ok.  ----------------------------------------------------------------- Colonoscopy- 08/15/11 hyperplastic polyps   Review of Systems  Constitutional: Negative.   HENT: Negative.   Eyes: Negative.   Respiratory: Positive for apnea.   Cardiovascular: Negative.   Gastrointestinal: Negative.   Endocrine: Negative.   Genitourinary: Positive for difficulty urinating.  Musculoskeletal: Negative.   Skin: Negative.   Allergic/Immunologic: Negative.   Neurological: Negative.   Hematological: Negative.   Psychiatric/Behavioral: Negative.     Social History      He  reports that  has never smoked. he has never used smokeless tobacco. He reports that he drinks about 3.0 oz of alcohol per week. He reports that he does not use drugs.       Social History   Socioeconomic History  . Marital status: Married    Spouse name: None  . Number of children: 3  . Years of education: 21  . Highest education level: None  Social Needs  . Financial resource strain: None  . Food insecurity - worry: None  . Food insecurity - inability: None  . Transportation needs - medical: None  . Transportation needs - non-medical: None  Occupational History  . None  Tobacco Use  . Smoking status: Never Smoker  . Smokeless tobacco: Never Used  Substance and Sexual Activity  . Alcohol use: Yes    Alcohol/week: 3.0 oz    Types: 5 Shots of liquor per week    Comment: 1-2 drinks daily  . Drug use: No  . Sexual activity: Yes      Birth control/protection: None    Comment: vasectomy  Other Topics Concern  . None  Social History Narrative  . None    Past Medical History:  Diagnosis Date  . GERD (gastroesophageal reflux disease)   . Hypothyroidism   . Mitral valve prolapse    echo 12/14 on paper chart     Patient Active Problem List   Diagnosis Date Noted  . Obstructive sleep apnea treated with continuous positive airway pressure (CPAP) 09/08/2017  . 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 Chronic nasal congestion 03/29/2017  . Hypersomnia with sleep apnea 03/29/2017  . Acquired hypothyroidism 06/27/2015  . Fracture of skull ([place]) 06/27/2015  . Hypercholesterolemia 06/27/2015  . Adult hypothyroidism 06/27/2015  . Snores 06/27/2015  . Problems with swallowing and mastication   . Swallowing difficulty   . Stricture and stenosis of esophagus   . Gastritis   . Vitamin D deficiency 11/18/2009  . Acid reflux 05/23/2009  . Cardiac murmur 05/23/2009    Past Surgical History:  Procedure Laterality Date  . COLONOSCOPY    . ESOPHAGOGASTRODUODENOSCOPY    . ESOPHAGOGASTRODUODENOSCOPY (EGD) WITH PROPOFOL N/A 01/18/2015   Procedure: ESOPHAGOGASTRODUODENOSCOPY (EGD) WITH PROPOFOL with dialtion;  Surgeon: [name], MD;  Location: [facility];  Service: Endoscopy;  Laterality: N/A;  . ESOPHAGOGASTRODUODENOSCOPY (EGD) WITH PROPOFOL N/A 05/17/2015   Procedure: ESOPHAGOGASTRODUODENOSCOPY (EGD) WITH PROPOFOL, WITH DIALATION;  Surgeon: [name], MD;  Location: [facility];  Service: Endoscopy;  Laterality: N/A;  . GANGLION CYST EXCISION Left    wrist  . TONSILLECTOMY    . VASECTOMY      Family History        Family Status  Relation Name Status  . Mother  Alive  . Father  Deceased  . Sister  Alive  . Brother  Alive  . Brother  Alive        His family history includes Cancer (age of onset: 3) in his father; Healthy in his brother, brother, and sister; Hyperlipidemia in his mother.      No Known  Allergies   Current Outpatient Medications:  .  aspirin 81 MG tablet, Take 81 mg by mouth daily., Disp: , Rfl:  .  mometasone (NASONEX) 50 MCG/ACT nasal spray, Place 2 sprays into the nose daily., Disp: 17 g, Rfl: 12 .  omeprazole (PRILOSEC) 40 MG capsule, TAKE 1 CAPSULE BY MOUTH  EVERY DAY, Disp: 90 capsule, Rfl: 3   Patient Care Team: Jerrol Banana., MD as PCP - General (Family Medicine)      Objective:   Vitals: BP 122/76 (BP Location: Left Arm, Patient Position: Sitting, Cuff Size: Normal)   Pulse 62   Temp 98 F (36.7 C) (Oral)   Resp 14   Ht 5\' 10"  (1.778 m)   Wt 217 lb (98.4 kg)   BMI 31.14 kg/m    Vitals:   09/15/17 0906  BP: 122/76  Pulse: 62  Resp: 14  Temp: 98 F (36.7 C)  TempSrc: Oral  Weight: 217 lb (98.4 kg)  Height: 5\' 10"  (1.778 m)     Physical Exam  Constitutional: He is oriented to person, place, and time. He appears well-developed and well-nourished.  HENT:  Head: Normocephalic and atraumatic.  Right Ear: External ear normal.  Left Ear: External ear normal.  Nose: Nose normal.  Mouth/Throat: Oropharynx is clear and moist.  Eyes: Conjunctivae are normal. No scleral icterus.  Neck: No thyromegaly present.  Cardiovascular: Normal rate, regular rhythm, normal heart sounds and intact distal pulses.  Pulmonary/Chest: Effort normal and breath sounds normal.  Abdominal: Soft.  Genitourinary: Rectum normal, prostate normal and penis normal.  Musculoskeletal: Normal range of motion. He exhibits no edema.  Lymphadenopathy:    He has no cervical adenopathy.  Neurological: He is alert and oriented to person, place, and time.  Skin: Skin is warm and dry.  Psychiatric: He has a normal mood and affect. His behavior is normal. Judgment and thought content normal.     Depression Screen PHQ 2/9 Scores 09/15/2017 09/08/2016  PHQ - 2 Score 0 1  PHQ- 9 Score 3 4      Assessment & Plan:     Routine Health Maintenance and Physical  Exam  Exercise Activities and Dietary recommendations Goals    None      Immunization History  Administered Date(s) Administered  . Tdap 06/23/2010    Health Maintenance  Topic Date Due  . HIV Screening  01/12/1976  . TETANUS/TDAP  06/23/2020  . COLONOSCOPY  09/02/2021  . INFLUENZA VACCINE  Completed  . Hepatitis C Screening  Completed     Discussed health benefits of physical activity, and encouraged him to engage in regular exercise appropriate for his age and condition.  OSA Wears CPAP.   --------------------------------------------------------------------   I have done the exam and reviewed the above chart and it is accurate to the best of my knowledge. Development worker, community has been used in  this note in any air is in the dictation or transcription are unintentional.  Wilhemena Durie, MD  [facility]

## 2017-09-21 LAB — CBC WITH DIFFERENTIAL/PLATELET
BASOS: 0 %
Basophils Absolute: 0 10*3/uL (ref 0.0–0.2)
EOS (ABSOLUTE): 0.2 10*3/uL (ref 0.0–0.4)
Eos: 3 %
HEMOGLOBIN: 15.5 g/dL (ref 13.0–17.7)
Hematocrit: 44 % (ref 37.5–51.0)
IMMATURE GRANS (ABS): 0 10*3/uL (ref 0.0–0.1)
IMMATURE GRANULOCYTES: 1 %
LYMPHS: 20 %
Lymphocytes Absolute: 1.7 10*3/uL (ref 0.7–3.1)
MCH: 32 pg (ref 26.6–33.0)
MCHC: 35.2 g/dL (ref 31.5–35.7)
MCV: 91 fL (ref 79–97)
Monocytes Absolute: 1 10*3/uL — ABNORMAL HIGH (ref 0.1–0.9)
Monocytes: 11 %
NEUTROS ABS: 5.5 10*3/uL (ref 1.4–7.0)
NEUTROS PCT: 65 %
PLATELETS: 309 10*3/uL (ref 150–379)
RBC: 4.85 x10E6/uL (ref 4.14–5.80)
RDW: 13.3 % (ref 12.3–15.4)
WBC: 8.4 10*3/uL (ref 3.4–10.8)

## 2017-09-21 LAB — COMPREHENSIVE METABOLIC PANEL
A/G RATIO: 1.6 (ref 1.2–2.2)
ALBUMIN: 4.4 g/dL (ref 3.5–5.5)
ALT: 39 IU/L (ref 0–44)
AST: 25 IU/L (ref 0–40)
Alkaline Phosphatase: 41 IU/L (ref 39–117)
BILIRUBIN TOTAL: 0.4 mg/dL (ref 0.0–1.2)
BUN / CREAT RATIO: 11 (ref 9–20)
BUN: 12 mg/dL (ref 6–24)
CALCIUM: 9.5 mg/dL (ref 8.7–10.2)
CHLORIDE: 104 mmol/L (ref 96–106)
CO2: 24 mmol/L (ref 20–29)
Creatinine, Ser: 1.13 mg/dL (ref 0.76–1.27)
GFR, EST AFRICAN AMERICAN: 84 mL/min/{1.73_m2} (ref >59)
GFR, EST NON AFRICAN AMERICAN: 72 mL/min/{1.73_m2} (ref >59)
Globulin, Total: 2.7 g/dL (ref 1.5–4.5)
Glucose: 96 mg/dL (ref 65–99)
POTASSIUM: 4.3 mmol/L (ref 3.5–5.2)
Sodium: 143 mmol/L (ref 134–144)
TOTAL PROTEIN: 7.1 g/dL (ref 6.0–8.5)

## 2017-09-21 LAB — LIPID PANEL
CHOL/HDL RATIO: 4.6 ratio (ref 0.0–5.0)
Cholesterol, Total: 208 mg/dL — ABNORMAL HIGH (ref 100–199)
HDL: 45 mg/dL (ref >39)
LDL CALC: 143 mg/dL — AB (ref 0–99)
TRIGLYCERIDES: 99 mg/dL (ref 0–149)
VLDL Cholesterol Cal: 20 mg/dL (ref 5–40)

## 2017-09-21 LAB — PSA: Prostate Specific Ag, Serum: 3.9 ng/mL (ref 0.0–4.0)

## 2017-09-21 LAB — TSH: TSH: 3.29 u[IU]/mL (ref 0.450–4.500)

## 2017-09-23 ENCOUNTER — Telehealth: Payer: Self-pay

## 2017-09-23 NOTE — Telephone Encounter (Signed)
Patient advised.

## 2017-09-23 NOTE — Telephone Encounter (Signed)
-----   Message from Jerrol Banana., MD sent at 09/23/2017  2:06 PM EDT ----- Labs stable.

## 2017-09-23 NOTE — Telephone Encounter (Signed)
LMTCB 09/23/2017  Thanks,   -Mickel Baas

## 2017-11-29 ENCOUNTER — Telehealth: Payer: Self-pay | Admitting: Gastroenterology

## 2017-11-29 ENCOUNTER — Other Ambulatory Visit: Payer: Self-pay

## 2017-11-29 DIAGNOSIS — K222 Esophageal obstruction: Secondary | ICD-10-CM

## 2017-11-29 NOTE — Telephone Encounter (Signed)
Pt has been scheduled for an EGD with [facility] at [facility] on 12/10/17.

## 2017-11-29 NOTE — Telephone Encounter (Signed)
Please call patients wife, Hope, to schedule an EGD. She has the list of dates he can come. [facility] [place] [phone]

## 2017-12-02 NOTE — Progress Notes (Signed)
09/08/16 Bilateral knee x-rays ordered and not resulted.  Patient ultimately seen by ortho

## 2017-12-07 ENCOUNTER — Other Ambulatory Visit: Payer: Self-pay

## 2017-12-08 ENCOUNTER — Telehealth: Payer: Self-pay | Admitting: Gastroenterology

## 2017-12-08 NOTE — Telephone Encounter (Signed)
Patient left voicemail on [facility] number to please call to discuss scheduling procedure.

## 2017-12-08 NOTE — Telephone Encounter (Signed)
Pt's wife called regarding EGD that has been scheduled for Friday, May 31st. She is not understanding why his MVP is now an issue when it wasn't in the past for his procedure. I advised her the Anesthesiologist from [facility] is requesting a clearance from his PCP. I have send to Dr. Rosanna Randy a message and faxed the cardiac clearance form to his office. She will contact Dr. Alben Spittle office to move this request along.
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
## 2017-12-09 ENCOUNTER — Ambulatory Visit: Payer: Managed Care, Other (non HMO) | Admitting: Nurse Practitioner

## 2017-12-09 ENCOUNTER — Encounter: Payer: Self-pay | Admitting: Nurse Practitioner

## 2017-12-09 VITALS — BP 112/73 | HR 62 | Ht 70.0 in | Wt 214.0 lb

## 2017-12-09 DIAGNOSIS — G4733 Obstructive sleep apnea (adult) (pediatric): Secondary | ICD-10-CM | POA: Diagnosis not present

## 2017-12-09 DIAGNOSIS — Z9989 Dependence on other enabling machines and devices: Secondary | ICD-10-CM

## 2017-12-09 NOTE — Discharge Instructions (Signed)
General Anesthesia, Adult, Care After °These instructions provide you with information about caring for yourself after your procedure. Your health care provider may also give you more specific instructions. Your treatment has been planned according to current medical practices, but problems sometimes occur. Call your health care provider if you have any problems or questions after your procedure. °What can I expect after the procedure? °After the procedure, it is common to have: °· Vomiting. °· A sore throat. °· Mental slowness. ° °It is common to feel: °· Nauseous. °· Cold or shivery. °· Sleepy. °· Tired. °· Sore or achy, even in parts of your body where you did not have surgery. ° °Follow these instructions at home: °For at least 24 hours after the procedure: °· Do not: °? Participate in activities where you could fall or become injured. °? Drive. °? Use heavy machinery. °? Drink alcohol. °? Take sleeping pills or medicines that cause drowsiness. °? Make important decisions or sign legal documents. °? Take care of children on your own. °· Rest. °Eating and drinking °· If you vomit, drink water, juice, or soup when you can drink without vomiting. °· Drink enough fluid to keep your urine clear or pale yellow. °· Make sure you have little or no nausea before eating solid foods. °· Follow the diet recommended by your health care provider. °General instructions °· Have a responsible adult stay with you until you are awake and alert. °· Return to your normal activities as told by your health care provider. Ask your health care provider what activities are safe for you. °· Take over-the-counter and prescription medicines only as told by your health care provider. °· If you smoke, do not smoke without supervision. °· Keep all follow-up visits as told by your health care provider. This is important. °Contact a health care provider if: °· You continue to have nausea or vomiting at home, and medicines are not helpful. °· You  cannot drink fluids or start eating again. °· You cannot urinate after 8-12 hours. °· You develop a skin rash. °· You have fever. °· You have increasing redness at the site of your procedure. °Get help right away if: °· You have difficulty breathing. °· You have chest pain. °· You have unexpected bleeding. °· You feel that you are having a life-threatening or urgent problem. °This information is not intended to replace advice given to you by your health care provider. Make sure you discuss any questions you have with your health care provider. °Document Released: 10/05/2000 Document Revised: 12/02/2015 Document Reviewed: 06/13/2015 °Elsevier Interactive Patient Education © 2018 Elsevier Inc. ° °

## 2017-12-09 NOTE — Progress Notes (Signed)
CPAP order to increase pressure successfully faxed to [facility].

## 2017-12-09 NOTE — Patient Instructions (Signed)
CPAP compliance 87% greater than 4 hours Will increase pressure to 12cm of h20 Follow-up in 4 months for repeat compliance

## 2017-12-10 ENCOUNTER — Ambulatory Visit: Payer: Managed Care, Other (non HMO) | Admitting: Anesthesiology

## 2017-12-10 ENCOUNTER — Encounter
Admission: RE | Disposition: A | Discharge status: Home / Self Care | Payer: Self-pay | Source: Ambulatory Visit | Attending: Gastroenterology

## 2017-12-10 ENCOUNTER — Ambulatory Visit
Admission: RE | Admit: 2017-12-10 | Discharge: 2017-12-10 | Disposition: A | Discharge status: Home / Self Care | Payer: Managed Care, Other (non HMO) | Source: Ambulatory Visit | Attending: Gastroenterology | Admitting: Gastroenterology

## 2017-12-10 DIAGNOSIS — E039 Hypothyroidism, unspecified: Secondary | ICD-10-CM | POA: Insufficient documentation

## 2017-12-10 DIAGNOSIS — K222 Esophageal obstruction: Secondary | ICD-10-CM | POA: Insufficient documentation

## 2017-12-10 DIAGNOSIS — K219 Gastro-esophageal reflux disease without esophagitis: Secondary | ICD-10-CM | POA: Diagnosis not present

## 2017-12-10 DIAGNOSIS — Z79899 Other long term (current) drug therapy: Secondary | ICD-10-CM | POA: Diagnosis not present

## 2017-12-10 DIAGNOSIS — R1310 Dysphagia, unspecified: Secondary | ICD-10-CM | POA: Diagnosis not present

## 2017-12-10 DIAGNOSIS — G4733 Obstructive sleep apnea (adult) (pediatric): Secondary | ICD-10-CM | POA: Diagnosis not present

## 2017-12-10 DIAGNOSIS — Z7982 Long term (current) use of aspirin: Secondary | ICD-10-CM | POA: Insufficient documentation

## 2017-12-10 DIAGNOSIS — I341 Nonrheumatic mitral (valve) prolapse: Secondary | ICD-10-CM | POA: Diagnosis not present

## 2017-12-10 HISTORY — PX: ESOPHAGOGASTRODUODENOSCOPY (EGD) WITH PROPOFOL: SHX5813

## 2017-12-10 HISTORY — DX: Dependence on other enabling machines and devices: Z99.89

## 2017-12-10 HISTORY — DX: Obstructive sleep apnea (adult) (pediatric): G47.33

## 2017-12-10 SURGERY — ESOPHAGOGASTRODUODENOSCOPY (EGD) WITH PROPOFOL
Anesthesia: General | Wound class: Clean Contaminated

## 2017-12-10 MED ORDER — LACTATED RINGERS IV SOLN
INTRAVENOUS | Status: DC
  Administered 2017-12-10 (×2): via INTRAVENOUS

## 2017-12-10 MED ORDER — PROPOFOL 10 MG/ML IV BOLUS
INTRAVENOUS | Status: DC | PRN
  Administered 2017-12-10 (×2): 80 mg via INTRAVENOUS
  Administered 2017-12-10: 20 mg via INTRAVENOUS

## 2017-12-10 MED ORDER — GLYCOPYRROLATE 0.2 MG/ML IJ SOLN
INTRAMUSCULAR | Status: DC | PRN
  Administered 2017-12-10: 0.1 mg via INTRAVENOUS

## 2017-12-10 MED ORDER — STERILE WATER FOR IRRIGATION IR SOLN
Status: DC | PRN
  Administered 2017-12-10: 08:00:00

## 2017-12-10 MED ORDER — LIDOCAINE HCL (CARDIAC) PF 100 MG/5ML IV SOSY
PREFILLED_SYRINGE | INTRAVENOUS | Status: DC | PRN
  Administered 2017-12-10: 20 mg via INTRAVENOUS

## 2017-12-10 MED ORDER — SODIUM CHLORIDE 0.9 % IV SOLN: INTRAVENOUS | Status: DC

## 2017-12-10 SURGICAL SUPPLY — 33 items
BALLN DILATOR 10-12 8 (BALLOONS)
BALLN DILATOR 12-15 8 (BALLOONS)
BALLN DILATOR 15-18 8 (BALLOONS) ×3
BALLN DILATOR CRE 0-12 8 (BALLOONS)
BALLN DILATOR ESOPH 8 10 CRE (MISCELLANEOUS) IMPLANT
BALLOON DILATOR 12-15 8 (BALLOONS) IMPLANT
BALLOON DILATOR 15-18 8 (BALLOONS) ×1 IMPLANT
BALLOON DILATOR CRE 0-12 8 (BALLOONS) IMPLANT
BLOCK BITE 60FR ADLT L/F GRN (MISCELLANEOUS) ×3 IMPLANT
CANISTER SUCT 1200ML W/VALVE (MISCELLANEOUS) ×3 IMPLANT
CLIP HMST 235XBRD CATH ROT (MISCELLANEOUS) IMPLANT
CLIP RESOLUTION 360 11X235 (MISCELLANEOUS)
ELECT REM PT RETURN 9FT ADLT (ELECTROSURGICAL)
ELECTRODE REM PT RTRN 9FT ADLT (ELECTROSURGICAL) IMPLANT
FCP ESCP3.2XJMB 240X2.8X (MISCELLANEOUS)
FORCEPS BIOP RAD 4 LRG CAP 4 (CUTTING FORCEPS) ×3 IMPLANT
FORCEPS BIOP RJ4 240 W/NDL (MISCELLANEOUS)
FORCEPS ESCP3.2XJMB 240X2.8X (MISCELLANEOUS) IMPLANT
GOWN CVR UNV OPN BCK APRN NK (MISCELLANEOUS) ×2 IMPLANT
GOWN ISOL THUMB LOOP REG UNIV (MISCELLANEOUS) ×4
INJECTOR VARIJECT VIN23 (MISCELLANEOUS) IMPLANT
KIT DEFENDO VALVE AND CONN (KITS) IMPLANT
KIT ENDO PROCEDURE OLY (KITS) ×3 IMPLANT
MARKER SPOT ENDO TATTOO 5ML (MISCELLANEOUS) IMPLANT
RETRIEVER NET PLAT FOOD (MISCELLANEOUS) IMPLANT
SNARE SHORT THROW 13M SML OVAL (MISCELLANEOUS) IMPLANT
SNARE SHORT THROW 30M LRG OVAL (MISCELLANEOUS) IMPLANT
SPOT EX ENDOSCOPIC TATTOO (MISCELLANEOUS)
SYR INFLATION 60ML (SYRINGE) ×3 IMPLANT
TRAP ETRAP POLY (MISCELLANEOUS) IMPLANT
VARIJECT INJECTOR VIN23 (MISCELLANEOUS)
WATER STERILE IRR 250ML POUR (IV SOLUTION) ×3 IMPLANT
WIRE CRE 18-20MM 8CM F G (MISCELLANEOUS) IMPLANT

## 2017-12-10 NOTE — H&P (Signed)
Christopher Lame, MD [address] [place], [place] [address] Phone:[phone] Fax : [phone]  Primary Care Physician:  Jerrol Banana., MD Primary Gastroenterologist:  Dr. Allen Norris  Pre-Procedure History & Physical: HPI:  Christopher Parsons is a 57 y.o. male is here for an endoscopy.   Past Medical History:  Diagnosis Date  . GERD (gastroesophageal reflux disease)   . Hypothyroidism   . Mitral valve prolapse    echo 12/14 on paper chart  . OSA on CPAP     Past Surgical History:  Procedure Laterality Date  . COLONOSCOPY    . ESOPHAGOGASTRODUODENOSCOPY    . ESOPHAGOGASTRODUODENOSCOPY (EGD) WITH PROPOFOL N/A 01/18/2015   Procedure: ESOPHAGOGASTRODUODENOSCOPY (EGD) WITH PROPOFOL with dialtion;  Surgeon: Christopher Lame, MD;  Location: [place];  Service: Endoscopy;  Laterality: N/A;  . ESOPHAGOGASTRODUODENOSCOPY (EGD) WITH PROPOFOL N/A 05/17/2015   Procedure: ESOPHAGOGASTRODUODENOSCOPY (EGD) WITH PROPOFOL, WITH DIALATION;  Surgeon: Christopher Lame, MD;  Location: [place];  Service: Endoscopy;  Laterality: N/A;  . GANGLION CYST EXCISION Left    wrist  . TONSILLECTOMY    . VASECTOMY      Prior to Admission medications   Medication Sig Start Date End Date Taking? Authorizing Provider  aspirin 81 MG tablet Take 81 mg by mouth daily.   Yes [provider]  mometasone (NASONEX) 50 MCG/ACT nasal spray Place 2 sprays into the nose daily. 03/29/17  Yes Dohmeier, Asencion Partridge, MD  omeprazole (PRILOSEC) 40 MG capsule TAKE 1 CAPSULE BY MOUTH  EVERY DAY 05/26/17  Yes Jerrol Banana., MD    Allergies as of 11/29/2017  . (No Known Allergies)    Family History  Problem Relation Age of Onset  . Hyperlipidemia Mother   . Cancer Father 34       lung cancer  . Healthy Sister   . Healthy Brother   . Healthy Brother     Social History   Socioeconomic History  . Marital status: Married    Spouse name: Not on file  . Number of children: 3    . Years of education: 82  . Highest education level: Not on file  Occupational History  . Not on file  Social Needs  . Financial resource strain: Not on file  . Food insecurity:    Worry: Not on file    Inability: Not on file  . Transportation needs:    Medical: Not on file    Non-medical: Not on file  Tobacco Use  . Smoking status: Never Smoker  . Smokeless tobacco: Never Used  Substance and Sexual Activity  . Alcohol use: Yes    Alcohol/week: 3.0 oz    Types: 5 Shots of liquor per week    Comment: 1-2 drinks daily  . Drug use: No  . Sexual activity: Yes    Birth control/protection: None    Comment: vasectomy  Lifestyle  . Physical activity:    Days per week: Not on file    Minutes per session: Not on file  . Stress: Not on file  Relationships  . Social connections:    Talks on phone: Not on file    Gets together: Not on file    Attends religious service: Not on file    Active member of club or organization: Not on file    Attends meetings of clubs or organizations: Not on file    Relationship status: Not on file  . Intimate partner violence:    Fear  of current or ex partner: Not on file    Emotionally abused: Not on file    Physically abused: Not on file    Forced sexual activity: Not on file  Other Topics Concern  . Not on file  Social History Narrative  . Not on file    Review of Systems: See HPI, otherwise negative ROS  Physical Exam: BP 118/79   Pulse (!) 58   Temp (!) 97.2 F (36.2 C) (Temporal)   Resp 16   Ht 5\' 10"  (1.778 m)   Wt 213 lb (96.6 kg)   SpO2 98%   BMI 30.56 kg/m  General:   Alert,  pleasant and cooperative in NAD Head:  Normocephalic and atraumatic. Neck:  Supple; no masses or thyromegaly. Lungs:  Clear throughout to auscultation.    Heart:  Regular rate and rhythm. Abdomen:  Soft, nontender and nondistended. Normal bowel sounds, without guarding, and without rebound.   Neurologic:  Alert and  oriented x4;  grossly normal  neurologically.  Impression/Plan: [name] is here for an endoscopy to be performed for dysphagia  Risks, benefits, limitations, and alternatives regarding  endoscopy have been reviewed with the patient.  Questions have been answered.  All parties agreeable.   Christopher Lame, MD  12/10/2017, 7:19 AM

## 2017-12-10 NOTE — Op Note (Signed)
[facility] Patient Name: Christopher Parsons Procedure Date: 12/10/2017 7:41 AM MRN: [id number] Account #: [id number] Date of Birth: Nov 23, 1960 Admit Type: Outpatient Age: 57 Room: [facility] OR ROOM 01 Gender: Male Note Status: Finalized Procedure:            Upper GI endoscopy Indications:          Dysphagia Providers:            [name] MD, MD Referring MD:         Janine Ores. Rosanna Randy, MD (Referring MD) Medicines:            Propofol per Anesthesia Complications:        No immediate complications. Procedure:            Pre-Anesthesia Assessment:                       - Prior to the procedure, a History and Physical was                        performed, and patient medications and allergies were                        reviewed. The patient's tolerance of previous                        anesthesia was also reviewed. The risks and benefits of                        the procedure and the sedation options and risks were                        discussed with the patient. All questions were                        answered, and informed consent was obtained. Prior                        Anticoagulants: The patient has taken no previous                        anticoagulant or antiplatelet agents. ASA Grade                        Assessment: II - A patient with mild systemic disease.                        After reviewing the risks and benefits, the patient was                        deemed in satisfactory condition to undergo the                        procedure.                       After obtaining informed consent, the endoscope was                        passed under direct vision. Throughout the procedure,  the patient's blood pressure, pulse, and oxygen                        saturations were monitored continuously. The Olympus                        [id number]-[id number] Endoscope (S#. [id number]) was introduced                        through  the mouth, and advanced to the second part of                        duodenum. The upper GI endoscopy was accomplished                        without difficulty. The patient tolerated the procedure                        well. Findings:      One benign-appearing, intrinsic severe stenosis was found at the       gastroesophageal junction. The stenosis was traversed after dilation. A       TTS dilator was passed through the scope. Dilation with a 12-13.5-15 mm       balloon dilator was performed to 15 mm. The dilation site was examined       following endoscope reinsertion and showed moderate improvement in       luminal narrowing.      The stomach was normal.      The examined duodenum was normal.      Two biopsies were obtained with cold forceps for histology in the middle       third of the esophagus. Impression:           - Benign-appearing esophageal stenosis. Dilated.                       - Normal stomach.                       - Normal examined duodenum.                       - Biopsy performed in the middle third of the esophagus. Recommendation:       - Discharge patient to home.                       - Resume previous diet.                       - Continue present medications.                       - Await pathology results. Procedure Code(s):    --- Professional ---                       [id number], Esophagogastroduodenoscopy, flexible, transoral;                        with transendoscopic balloon dilation of esophagus                        (less than 30 mm diameter)  [id number], Esophagogastroduodenoscopy, flexible, transoral;                        with biopsy, single or multiple Diagnosis Code(s):    --- Professional ---                       R13.10, Dysphagia, unspecified                       K22.2, Esophageal obstruction CPT copyright 2017 American Medical Association. All rights reserved. The codes documented in this report are preliminary and upon coder  review may  be revised to meet current compliance requirements. [name] MD, MD 12/10/2017 8:09:23 AM This report has been signed electronically. Number of Addenda: 0 Note Initiated On: 12/10/2017 7:41 AM Total Procedure Duration: 0 hours 7 minutes 8 seconds       [facility]

## 2017-12-10 NOTE — Anesthesia Preprocedure Evaluation (Signed)
Anesthesia Evaluation  Patient identified by MRN, date of birth, ID band Patient awake    Reviewed: Allergy & Precautions, H&P , NPO status , Patient's Chart, lab work & pertinent test results, reviewed documented beta blocker date and time   Airway Mallampati: II  TM Distance: >3 FB Neck ROM: full    Dental no notable dental hx.    Pulmonary sleep apnea and Continuous Positive Airway Pressure Ventilation ,    Pulmonary exam normal breath sounds clear to auscultation       Cardiovascular Exercise Tolerance: Good + Valvular Problems/Murmurs MVP  Rhythm:regular Rate:Normal     Neuro/Psych negative neurological ROS  negative psych ROS   GI/Hepatic Neg liver ROS, GERD  ,  Endo/Other  Hypothyroidism   Renal/GU negative Renal ROS  negative genitourinary   Musculoskeletal   Abdominal   Peds  Hematology negative hematology ROS (+)   Anesthesia Other Findings   Reproductive/Obstetrics negative OB ROS                             Anesthesia Physical Anesthesia Plan  ASA: II  Anesthesia Plan: General   Post-op Pain Management:    Induction:   PONV Risk Score and Plan:   Airway Management Planned:   Additional Equipment:   Intra-op Plan:   Post-operative Plan:   Informed Consent: I have reviewed the patients History and Physical, chart, labs and discussed the procedure including the risks, benefits and alternatives for the proposed anesthesia with the patient or authorized representative who has indicated his/her understanding and acceptance.   Dental Advisory Given  Plan Discussed with: CRNA  Anesthesia Plan Comments:         Anesthesia Quick Evaluation

## 2017-12-10 NOTE — Anesthesia Postprocedure Evaluation (Signed)
Anesthesia Post Note  Patient: Christopher Parsons  Procedure(s) Performed: ESOPHAGOGASTRODUODENOSCOPY (EGD) WITH PROPOFOL (N/A )  Patient location during evaluation: PACU Anesthesia Type: General Level of consciousness: awake and alert Pain management: pain level controlled Vital Signs Assessment: post-procedure vital signs reviewed and stable Respiratory status: spontaneous breathing, nonlabored ventilation, respiratory function stable and patient connected to nasal cannula oxygen Cardiovascular status: blood pressure returned to baseline and stable Postop Assessment: no apparent nausea or vomiting Anesthetic complications: no    Alisa Graff

## 2017-12-10 NOTE — Anesthesia Procedure Notes (Signed)
Procedure Name: MAC Date/Time: 12/10/2017 7:57 AM Performed by: Lind Guest, CRNA Pre-anesthesia Checklist: Patient identified, Emergency Drugs available, Suction available, Patient being monitored and Timeout performed Patient Re-evaluated:Patient Re-evaluated prior to induction Oxygen Delivery Method: Nasal cannula

## 2017-12-10 NOTE — Transfer of Care (Signed)
Immediate Anesthesia Transfer of Care Note  Patient: Christopher Parsons  Procedure(s) Performed: ESOPHAGOGASTRODUODENOSCOPY (EGD) WITH PROPOFOL (N/A )  Patient Location: PACU  Anesthesia Type: General  Level of Consciousness: awake, alert  and patient cooperative  Airway and Oxygen Therapy: Patient Spontanous Breathing and Patient connected to supplemental oxygen  Post-op Assessment: Post-op Vital signs reviewed, Patient's Cardiovascular Status Stable, Respiratory Function Stable, Patent Airway and No signs of Nausea or vomiting  Post-op Vital Signs: Reviewed and stable  Complications: No apparent anesthesia complications

## 2017-12-10 NOTE — Progress Notes (Signed)
I agree with the assessment and plan as directed by NP . Please add Epworth score and fatigue score under ROS. The patient is known to me .   Luv Mish, MD

## 2017-12-12 ENCOUNTER — Encounter: Payer: Self-pay | Admitting: Gastroenterology

## 2017-12-14 ENCOUNTER — Telehealth: Payer: Self-pay

## 2017-12-14 LAB — SURGICAL PATHOLOGY

## 2017-12-14 NOTE — Telephone Encounter (Signed)
LVM for pt to return my call.

## 2017-12-14 NOTE — Telephone Encounter (Signed)
-----   Message from [name], MD sent at 12/14/2017 11:25 AM EDT ----- Let the patient know that his biopsies of his esophagus did not show any worrisome features.

## 2017-12-15 NOTE — Telephone Encounter (Signed)
Pt returned call and was notified of pathology results.

## 2017-12-23 ENCOUNTER — Other Ambulatory Visit: Payer: Self-pay

## 2017-12-23 DIAGNOSIS — K222 Esophageal obstruction: Secondary | ICD-10-CM

## 2018-01-20 ENCOUNTER — Encounter: Payer: Self-pay | Admitting: *Deleted

## 2018-01-20 ENCOUNTER — Other Ambulatory Visit: Payer: Self-pay

## 2018-01-26 NOTE — Discharge Instructions (Signed)
General Anesthesia, Adult, Care After °These instructions provide you with information about caring for yourself after your procedure. Your health care provider may also give you more specific instructions. Your treatment has been planned according to current medical practices, but problems sometimes occur. Call your health care provider if you have any problems or questions after your procedure. °What can I expect after the procedure? °After the procedure, it is common to have: °· Vomiting. °· A sore throat. °· Mental slowness. ° °It is common to feel: °· Nauseous. °· Cold or shivery. °· Sleepy. °· Tired. °· Sore or achy, even in parts of your body where you did not have surgery. ° °Follow these instructions at home: °For at least 24 hours after the procedure: °· Do not: °? Participate in activities where you could fall or become injured. °? Drive. °? Use heavy machinery. °? Drink alcohol. °? Take sleeping pills or medicines that cause drowsiness. °? Make important decisions or sign legal documents. °? Take care of children on your own. °· Rest. °Eating and drinking °· If you vomit, drink water, juice, or soup when you can drink without vomiting. °· Drink enough fluid to keep your urine clear or pale yellow. °· Make sure you have little or no nausea before eating solid foods. °· Follow the diet recommended by your health care provider. °General instructions °· Have a responsible adult stay with you until you are awake and alert. °· Return to your normal activities as told by your health care provider. Ask your health care provider what activities are safe for you. °· Take over-the-counter and prescription medicines only as told by your health care provider. °· If you smoke, do not smoke without supervision. °· Keep all follow-up visits as told by your health care provider. This is important. °Contact a health care provider if: °· You continue to have nausea or vomiting at home, and medicines are not helpful. °· You  cannot drink fluids or start eating again. °· You cannot urinate after 8-12 hours. °· You develop a skin rash. °· You have fever. °· You have increasing redness at the site of your procedure. °Get help right away if: °· You have difficulty breathing. °· You have chest pain. °· You have unexpected bleeding. °· You feel that you are having a life-threatening or urgent problem. °This information is not intended to replace advice given to you by your health care provider. Make sure you discuss any questions you have with your health care provider. °Document Released: 10/05/2000 Document Revised: 12/02/2015 Document Reviewed: 06/13/2015 °Elsevier Interactive Patient Education © 2018 Elsevier Inc. ° °

## 2018-01-27 ENCOUNTER — Ambulatory Visit: Payer: Managed Care, Other (non HMO) | Admitting: Anesthesiology

## 2018-01-27 ENCOUNTER — Encounter
Admission: RE | Disposition: A | Discharge status: Home / Self Care | Payer: Self-pay | Source: Ambulatory Visit | Attending: Gastroenterology

## 2018-01-27 ENCOUNTER — Ambulatory Visit
Admission: RE | Admit: 2018-01-27 | Discharge: 2018-01-27 | Disposition: A | Discharge status: Home / Self Care | Payer: Managed Care, Other (non HMO) | Source: Ambulatory Visit | Attending: Gastroenterology | Admitting: Gastroenterology

## 2018-01-27 DIAGNOSIS — Z9989 Dependence on other enabling machines and devices: Secondary | ICD-10-CM | POA: Insufficient documentation

## 2018-01-27 DIAGNOSIS — K219 Gastro-esophageal reflux disease without esophagitis: Secondary | ICD-10-CM | POA: Diagnosis not present

## 2018-01-27 DIAGNOSIS — I341 Nonrheumatic mitral (valve) prolapse: Secondary | ICD-10-CM | POA: Diagnosis not present

## 2018-01-27 DIAGNOSIS — E039 Hypothyroidism, unspecified: Secondary | ICD-10-CM | POA: Diagnosis not present

## 2018-01-27 DIAGNOSIS — G4733 Obstructive sleep apnea (adult) (pediatric): Secondary | ICD-10-CM | POA: Diagnosis not present

## 2018-01-27 DIAGNOSIS — Z79899 Other long term (current) drug therapy: Secondary | ICD-10-CM | POA: Insufficient documentation

## 2018-01-27 DIAGNOSIS — K222 Esophageal obstruction: Secondary | ICD-10-CM | POA: Diagnosis not present

## 2018-01-27 DIAGNOSIS — Z7982 Long term (current) use of aspirin: Secondary | ICD-10-CM | POA: Insufficient documentation

## 2018-01-27 DIAGNOSIS — R1310 Dysphagia, unspecified: Secondary | ICD-10-CM

## 2018-01-27 HISTORY — PX: ESOPHAGEAL DILATION: SHX303

## 2018-01-27 HISTORY — PX: ESOPHAGOGASTRODUODENOSCOPY (EGD) WITH PROPOFOL: SHX5813

## 2018-01-27 SURGERY — ESOPHAGOGASTRODUODENOSCOPY (EGD) WITH PROPOFOL
Anesthesia: General

## 2018-01-27 MED ORDER — LIDOCAINE HCL (CARDIAC) PF 100 MG/5ML IV SOSY
PREFILLED_SYRINGE | INTRAVENOUS | Status: DC | PRN
  Administered 2018-01-27: 100 mg via INTRAVENOUS

## 2018-01-27 MED ORDER — PROPOFOL 10 MG/ML IV BOLUS
INTRAVENOUS | Status: DC | PRN
  Administered 2018-01-27: 80 mg via INTRAVENOUS
  Administered 2018-01-27: 60 mg via INTRAVENOUS
  Administered 2018-01-27: 40 mg via INTRAVENOUS

## 2018-01-27 MED ORDER — GLYCOPYRROLATE 0.2 MG/ML IJ SOLN
INTRAMUSCULAR | Status: DC | PRN
  Administered 2018-01-27: 0.2 mg via INTRAVENOUS

## 2018-01-27 MED ORDER — SODIUM CHLORIDE 0.9 % IV SOLN: INTRAVENOUS | Status: DC

## 2018-01-27 MED ORDER — LACTATED RINGERS IV SOLN
INTRAVENOUS | Status: DC
  Administered 2018-01-27: 08:00:00 via INTRAVENOUS

## 2018-01-27 SURGICAL SUPPLY — 33 items
BALLN DILATOR 10-12 8 (BALLOONS)
BALLN DILATOR 12-15 8 (BALLOONS)
BALLN DILATOR 15-18 8 (BALLOONS) ×3
BALLN DILATOR CRE 0-12 8 (BALLOONS)
BALLN DILATOR ESOPH 8 10 CRE (MISCELLANEOUS) IMPLANT
BALLOON DILATOR 12-15 8 (BALLOONS) IMPLANT
BALLOON DILATOR 15-18 8 (BALLOONS) ×1 IMPLANT
BALLOON DILATOR CRE 0-12 8 (BALLOONS) IMPLANT
BLOCK BITE 60FR ADLT L/F GRN (MISCELLANEOUS) ×3 IMPLANT
CANISTER SUCT 1200ML W/VALVE (MISCELLANEOUS) ×3 IMPLANT
CLIP HMST 235XBRD CATH ROT (MISCELLANEOUS) IMPLANT
CLIP RESOLUTION 360 11X235 (MISCELLANEOUS)
ELECT REM PT RETURN 9FT ADLT (ELECTROSURGICAL)
ELECTRODE REM PT RTRN 9FT ADLT (ELECTROSURGICAL) IMPLANT
FCP ESCP3.2XJMB 240X2.8X (MISCELLANEOUS)
FORCEPS BIOP RAD 4 LRG CAP 4 (CUTTING FORCEPS) IMPLANT
FORCEPS BIOP RJ4 240 W/NDL (MISCELLANEOUS)
FORCEPS ESCP3.2XJMB 240X2.8X (MISCELLANEOUS) IMPLANT
GOWN CVR UNV OPN BCK APRN NK (MISCELLANEOUS) ×2 IMPLANT
GOWN ISOL THUMB LOOP REG UNIV (MISCELLANEOUS) ×4
INJECTOR VARIJECT VIN23 (MISCELLANEOUS) IMPLANT
KIT DEFENDO VALVE AND CONN (KITS) IMPLANT
KIT ENDO PROCEDURE OLY (KITS) ×3 IMPLANT
MARKER SPOT ENDO TATTOO 5ML (MISCELLANEOUS) IMPLANT
RETRIEVER NET PLAT FOOD (MISCELLANEOUS) IMPLANT
SNARE SHORT THROW 13M SML OVAL (MISCELLANEOUS) IMPLANT
SNARE SHORT THROW 30M LRG OVAL (MISCELLANEOUS) IMPLANT
SPOT EX ENDOSCOPIC TATTOO (MISCELLANEOUS)
SYR INFLATION 60ML (SYRINGE) ×3 IMPLANT
TRAP ETRAP POLY (MISCELLANEOUS) IMPLANT
VARIJECT INJECTOR VIN23 (MISCELLANEOUS)
WATER STERILE IRR 250ML POUR (IV SOLUTION) ×3 IMPLANT
WIRE CRE 18-20MM 8CM F G (MISCELLANEOUS) IMPLANT

## 2018-01-27 NOTE — H&P (Signed)
Christopher Lame, MD [address], [place] [address] [phone] Fax : [phone]  Primary Care Physician:  Jerrol Banana., MD Primary Gastroenterologist:  Dr. [facility]  Pre-Procedure History & Physical: HPI:  Christopher Parsons is a 57 y.o. male is here for an endoscopy.   Past Medical History:  Diagnosis Date  . GERD (gastroesophageal reflux disease)   . Hypothyroidism   . Mitral valve prolapse    echo 12/14 on paper chart  . OSA on CPAP     Past Surgical History:  Procedure Laterality Date  . COLONOSCOPY    . ESOPHAGOGASTRODUODENOSCOPY    . ESOPHAGOGASTRODUODENOSCOPY (EGD) WITH PROPOFOL N/A 01/18/2015   Procedure: ESOPHAGOGASTRODUODENOSCOPY (EGD) WITH PROPOFOL with dialtion;  Surgeon: Christopher Lame, MD;  Location: [facility];  Service: Endoscopy;  Laterality: N/A;  . ESOPHAGOGASTRODUODENOSCOPY (EGD) WITH PROPOFOL N/A 05/17/2015   Procedure: ESOPHAGOGASTRODUODENOSCOPY (EGD) WITH PROPOFOL, WITH DIALATION;  Surgeon: Christopher Lame, MD;  Location: [facility];  Service: Endoscopy;  Laterality: N/A;  . ESOPHAGOGASTRODUODENOSCOPY (EGD) WITH PROPOFOL N/A 12/10/2017   Procedure: ESOPHAGOGASTRODUODENOSCOPY (EGD) WITH PROPOFOL;  Surgeon: Christopher Lame, MD;  Location: [facility];  Service: Endoscopy;  Laterality: N/A;  . GANGLION CYST EXCISION Left    wrist  . TONSILLECTOMY    . VASECTOMY      Prior to Admission medications   Medication Sig Start Date End Date Taking? Authorizing Provider  aspirin 81 MG tablet Take 81 mg by mouth daily.   Yes [provider]  mometasone (NASONEX) 50 MCG/ACT nasal spray Place 2 sprays into the nose daily. 03/29/17  Yes Dohmeier, Asencion Partridge, MD  omeprazole (PRILOSEC) 40 MG capsule TAKE 1 CAPSULE BY MOUTH  EVERY DAY 05/26/17  Yes Jerrol Banana., MD    Allergies as of 12/23/2017  . (No Known Allergies)    Family History  Problem Relation Age of Onset  . Hyperlipidemia Mother   .  Cancer Father 11       lung cancer  . Healthy Sister   . Healthy Brother   . Healthy Brother     Social History   Socioeconomic History  . Marital status: Married    Spouse name: Not on file  . Number of children: 3  . Years of education: 65  . Highest education level: Not on file  Occupational History  . Not on file  Social Needs  . Financial resource strain: Not on file  . Food insecurity:    Worry: Not on file    Inability: Not on file  . Transportation needs:    Medical: Not on file    Non-medical: Not on file  Tobacco Use  . Smoking status: Never Smoker  . Smokeless tobacco: Never Used  Substance and Sexual Activity  . Alcohol use: Yes    Alcohol/week: 3.0 oz    Types: 5 Shots of liquor per week    Comment: 1-2 drinks daily  . Drug use: No  . Sexual activity: Yes    Birth control/protection: None    Comment: vasectomy  Lifestyle  . Physical activity:    Days per week: Not on file    Minutes per session: Not on file  . Stress: Not on file  Relationships  . Social connections:    Talks on phone: Not on file    Gets together: Not on file    Attends religious service: Not on file    Active member of club or organization: Not on  file    Attends meetings of clubs or organizations: Not on file    Relationship status: Not on file  . Intimate partner violence:    Fear of current or ex partner: Not on file    Emotionally abused: Not on file    Physically abused: Not on file    Forced sexual activity: Not on file  Other Topics Concern  . Not on file  Social History Narrative  . Not on file    Review of Systems: See HPI, otherwise negative ROS  Physical Exam: BP 127/72   Pulse (!) 58   Temp (!) 97.2 F (36.2 C)   Ht 5\' 10"  (1.778 m)   Wt 218 lb (98.9 kg)   SpO2 98%   BMI 31.28 kg/m  General:   Alert,  pleasant and cooperative in NAD Head:  Normocephalic and atraumatic. Neck:  Supple; no masses or thyromegaly. Lungs:  Clear throughout to  auscultation.    Heart:  Regular rate and rhythm. Abdomen:  Soft, nontender and nondistended. Normal bowel sounds, without guarding, and without rebound.   Neurologic:  Alert and  oriented x4;  grossly normal neurologically.  Impression/Plan: Christopher Parsons is here for an endoscopy to be performed for dysphagia  Risks, benefits, limitations, and alternatives regarding  endoscopy have been reviewed with the patient.  Questions have been answered.  All parties agreeable.   Christopher Lame, MD  01/27/2018, 7:30 AM

## 2018-01-27 NOTE — Transfer of Care (Signed)
Immediate Anesthesia Transfer of Care Note  Patient: Christopher Parsons  Procedure(s) Performed: ESOPHAGOGASTRODUODENOSCOPY (EGD) WITH PROPOFOL (N/A ) ESOPHAGEAL DILATION (N/A )  Patient Location: PACU  Anesthesia Type: General  Level of Consciousness: awake, alert  and patient cooperative  Airway and Oxygen Therapy: Patient Spontanous Breathing and Patient connected to supplemental oxygen  Post-op Assessment: Post-op Vital signs reviewed, Patient's Cardiovascular Status Stable, Respiratory Function Stable, Patent Airway and No signs of Nausea or vomiting  Post-op Vital Signs: Reviewed and stable  Complications: No apparent anesthesia complications

## 2018-01-27 NOTE — Anesthesia Postprocedure Evaluation (Signed)
Anesthesia Post Note  Patient: Christopher Parsons  Procedure(s) Performed: ESOPHAGOGASTRODUODENOSCOPY (EGD) WITH PROPOFOL (N/A ) ESOPHAGEAL DILATION (N/A )  Patient location during evaluation: PACU Anesthesia Type: General Level of consciousness: awake Pain management: pain level controlled Vital Signs Assessment: post-procedure vital signs reviewed and stable Respiratory status: spontaneous breathing Cardiovascular status: blood pressure returned to baseline Postop Assessment: no headache Anesthetic complications: no    Lavonna Monarch

## 2018-01-27 NOTE — Anesthesia Preprocedure Evaluation (Addendum)
Anesthesia Evaluation  Patient identified by MRN, date of birth, ID band Patient awake    Reviewed: Allergy & Precautions, NPO status , Patient's Chart, lab work & pertinent test results, reviewed documented beta blocker date and time   Airway Mallampati: II  TM Distance: >3 FB Neck ROM: Full    Dental no notable dental hx.    Pulmonary sleep apnea ,    Pulmonary exam normal breath sounds clear to auscultation       Cardiovascular Normal cardiovascular exam+ Valvular Problems/Murmurs (MVP)  Rhythm:Regular Rate:Normal     Neuro/Psych negative neurological ROS  negative psych ROS   GI/Hepatic Neg liver ROS, GERD  ,  Endo/Other  Hypothyroidism   Renal/GU negative Renal ROS  negative genitourinary   Musculoskeletal negative musculoskeletal ROS (+)   Abdominal Normal abdominal exam  (+)   Peds  Hematology negative hematology ROS (+)   Anesthesia Other Findings   Reproductive/Obstetrics                           Anesthesia Physical Anesthesia Plan  ASA: II  Anesthesia Plan: General   Post-op Pain Management:    Induction: Intravenous  PONV Risk Score and Plan:   Airway Management Planned: Natural Airway  Additional Equipment: None  Intra-op Plan:   Post-operative Plan:   Informed Consent: I have reviewed the patients History and Physical, chart, labs and discussed the procedure including the risks, benefits and alternatives for the proposed anesthesia with the patient or authorized representative who has indicated his/her understanding and acceptance.     Plan Discussed with: CRNA, Anesthesiologist and Surgeon  Anesthesia Plan Comments:         Anesthesia Quick Evaluation

## 2018-01-27 NOTE — Op Note (Signed)
[facility] Patient Name: Christopher Parsons Procedure Date: 01/27/2018 7:47 AM MRN: [id number] Account #: [id number] Date of Birth: [birth year] Admit Type: Outpatient Age: 57 Room: [facility] OR ROOM 01 Gender: Male Note Status: Finalized Procedure:            Upper GI endoscopy Indications:          Dysphagia Providers:            [name] MD, MD Referring MD:         Janine Ores. Rosanna Randy, MD (Referring MD) Medicines:            Propofol per Anesthesia Complications:        No immediate complications. Procedure:            Pre-Anesthesia Assessment:                       - Prior to the procedure, a History and Physical was                        performed, and patient medications and allergies were                        reviewed. The patient's tolerance of previous                        anesthesia was also reviewed. The risks and benefits of                        the procedure and the sedation options and risks were                        discussed with the patient. All questions were                        answered, and informed consent was obtained. Prior                        Anticoagulants: The patient has taken no previous                        anticoagulant or antiplatelet agents. ASA Grade                        Assessment: II - A patient with mild systemic disease.                        After reviewing the risks and benefits, the patient was                        deemed in satisfactory condition to undergo the                        procedure.                       After obtaining informed consent, the endoscope was                        passed under direct vision. Throughout the procedure,  the patient's blood pressure, pulse, and oxygen                        saturations were monitored continuously. The was                        introduced through the mouth, and advanced to the                        second part  of duodenum. The upper GI endoscopy was                        accomplished without difficulty. The patient tolerated                        the procedure well. Findings:      One benign-appearing, intrinsic mild stenosis was found at the       gastroesophageal junction. The stenosis was traversed. A TTS dilator was       passed through the scope. Dilation with a 15-16.5-18 mm balloon dilator       was performed to 18 mm. The dilation site was examined following       endoscope reinsertion and showed complete resolution of luminal       narrowing.      The stomach was normal.      The examined duodenum was normal. Impression:           - Benign-appearing esophageal stenosis. Dilated.                       - Normal stomach.                       - Normal examined duodenum.                       - No specimens collected. Recommendation:       - Discharge patient to home.                       - Resume previous diet.                       - Continue present medications.                       - Repeat upper endoscopy PRN for retreatment. Procedure Code(s):    --- Professional ---                       [phone], Esophagogastroduodenoscopy, flexible, transoral;                        with transendoscopic balloon dilation of esophagus                        (less than 30 mm diameter) Diagnosis Code(s):    --- Professional ---                       R13.10, Dysphagia, unspecified                       K22.2, Esophageal obstruction CPT copyright 2017 American Medical  Association. All rights reserved. The codes documented in this report are preliminary and upon coder review may  be revised to meet current compliance requirements. [name] MD, MD 01/27/2018 8:07:53 AM This report has been signed electronically. Number of Addenda: 0 Note Initiated On: 01/27/2018 7:47 AM      [facility]

## 2018-01-27 NOTE — Anesthesia Procedure Notes (Signed)
Procedure Name: MAC Date/Time: 01/27/2018 7:59 AM Performed by: Lind Guest, CRNA Pre-anesthesia Checklist: Patient identified, Emergency Drugs available, Suction available, Patient being monitored and Timeout performed Patient Re-evaluated:Patient Re-evaluated prior to induction Oxygen Delivery Method: Nasal cannula

## 2018-01-28 ENCOUNTER — Encounter: Payer: Self-pay | Admitting: Gastroenterology

## 2018-04-01 ENCOUNTER — Other Ambulatory Visit: Payer: Self-pay | Admitting: Family Medicine
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
## 2018-04-14 ENCOUNTER — Encounter: Payer: Self-pay | Admitting: Nurse Practitioner

## 2018-04-14 ENCOUNTER — Ambulatory Visit: Payer: Managed Care, Other (non HMO) | Admitting: Nurse Practitioner

## 2018-04-14 VITALS — BP 117/73 | HR 59 | Ht 70.0 in | Wt 221.6 lb

## 2018-04-14 DIAGNOSIS — Z9989 Dependence on other enabling machines and devices: Secondary | ICD-10-CM | POA: Diagnosis not present

## 2018-04-14 DIAGNOSIS — G4733 Obstructive sleep apnea (adult) (pediatric): Secondary | ICD-10-CM | POA: Diagnosis not present

## 2018-04-14 NOTE — Patient Instructions (Signed)
CPAP compliance 93% greater than 4 hours Continue same settings Follow-up yearly for repeat compliance

## 2018-09-26 ENCOUNTER — Encounter: Payer: Managed Care, Other (non HMO) | Admitting: Family Medicine

## 2018-10-04 ENCOUNTER — Encounter: Payer: Managed Care, Other (non HMO) | Admitting: Family Medicine

## 2019-01-04 ENCOUNTER — Ambulatory Visit (INDEPENDENT_AMBULATORY_CARE_PROVIDER_SITE_OTHER): Payer: Managed Care, Other (non HMO) | Admitting: Family Medicine

## 2019-01-04 ENCOUNTER — Other Ambulatory Visit: Payer: Self-pay

## 2019-01-04 ENCOUNTER — Encounter: Payer: Self-pay | Admitting: Family Medicine

## 2019-01-04 VITALS — BP 118/76 | HR 60 | Temp 98.0°F | Resp 16 | Ht 70.0 in | Wt 221.0 lb

## 2019-01-04 DIAGNOSIS — Z6831 Body mass index (BMI) 31.0-31.9, adult: Secondary | ICD-10-CM

## 2019-01-04 DIAGNOSIS — K222 Esophageal obstruction: Secondary | ICD-10-CM

## 2019-01-04 DIAGNOSIS — Z0000 Encounter for general adult medical examination without abnormal findings: Secondary | ICD-10-CM

## 2019-01-04 DIAGNOSIS — K219 Gastro-esophageal reflux disease without esophagitis: Secondary | ICD-10-CM

## 2019-01-04 DIAGNOSIS — E6609 Other obesity due to excess calories: Secondary | ICD-10-CM

## 2019-01-04 DIAGNOSIS — G4733 Obstructive sleep apnea (adult) (pediatric): Secondary | ICD-10-CM | POA: Diagnosis not present

## 2019-01-04 DIAGNOSIS — Z9989 Dependence on other enabling machines and devices: Secondary | ICD-10-CM

## 2019-01-04 DIAGNOSIS — Z125 Encounter for screening for malignant neoplasm of prostate: Secondary | ICD-10-CM | POA: Diagnosis not present

## 2019-01-04 DIAGNOSIS — E039 Hypothyroidism, unspecified: Secondary | ICD-10-CM

## 2019-01-04 LAB — POCT URINALYSIS DIPSTICK
Bilirubin, UA: NEGATIVE
Blood, UA: NEGATIVE
Glucose, UA: NEGATIVE
Ketones, UA: NEGATIVE
Leukocytes, UA: NEGATIVE
Nitrite, UA: NEGATIVE
Protein, UA: NEGATIVE
Spec Grav, UA: 1.025 (ref 1.010–1.025)
Urobilinogen, UA: 0.2 E.U./dL
pH, UA: 6 (ref 5.0–8.0)

## 2019-01-04 NOTE — Progress Notes (Signed)
Patient: Christopher Parsons, Male    DOB: May 20, 1961, 58 y.o.   MRN: [id number] Visit Date: 01/04/2019  Today's Provider: Wilhemena Durie, MD   Chief Complaint  Patient presents with  . Annual Exam   Subjective:     Annual physical exam Christopher Parsons is a 58 y.o. male who presents today for health maintenance and complete physical. He feels well. He reports exercising not regularly. He reports he is sleeping well.  Colonoscopy- 09/03/2011. Hyperplastic polyps. Repeat 74yrs.  Tdap- 06/23/2010.    Review of Systems  Constitutional: Negative.   HENT: Negative.   Eyes: Negative.   Respiratory: Negative.   Cardiovascular: Negative.   Gastrointestinal: Negative.   Endocrine: Negative.   Genitourinary: Positive for testicular pain.  Musculoskeletal: Negative.   Skin: Negative.   Allergic/Immunologic: Negative.   Neurological: Negative.   Hematological: Negative.   Psychiatric/Behavioral: Negative.     Social History      He  reports that he has never smoked. He has never used smokeless tobacco. He reports current alcohol use of about 5.0 standard drinks of alcohol per week. He reports that he does not use drugs.       Social History   Socioeconomic History  . Marital status: Married    Spouse name: Not on file  . Number of children: 3  . Years of education: 41  . Highest education level: Not on file  Occupational History  . Not on file  Social Needs  . Financial resource strain: Not on file  . Food insecurity    Worry: Not on file    Inability: Not on file  . Transportation needs    Medical: Not on file    Non-medical: Not on file  Tobacco Use  . Smoking status: Never Smoker  . Smokeless tobacco: Never Used  Substance and Sexual Activity  . Alcohol use: Yes    Alcohol/week: 5.0 standard drinks    Types: 5 Shots of liquor per week    Comment: 1-2 drinks daily  . Drug use: No  . Sexual activity: Yes    Birth control/protection: None     Comment: vasectomy  Lifestyle  . Physical activity    Days per week: Not on file    Minutes per session: Not on file  . Stress: Not on file  Relationships  . Social Herbalist on phone: Not on file    Gets together: Not on file    Attends religious service: Not on file    Active member of club or organization: Not on file    Attends meetings of clubs or organizations: Not on file    Relationship status: Not on file  Other Topics Concern  . Not on file  Social History Narrative  . Not on file    Past Medical History:  Diagnosis Date  . GERD (gastroesophageal reflux disease)   . Hypothyroidism   . Mitral valve prolapse    echo 12/14 on paper chart  . OSA on CPAP      Patient Active Problem List   Diagnosis Date Noted  . Obstructive sleep apnea treated with continuous positive airway pressure (CPAP) 09/08/2017  . 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 Chronic nasal congestion 03/29/2017  . Hypersomnia with sleep apnea 03/29/2017  . Acquired hypothyroidism 06/27/2015  . Fracture of skull ([place]) 06/27/2015  . Hypercholesterolemia 06/27/2015  . Adult hypothyroidism 06/27/2015  . Snores 06/27/2015  . Problems with swallowing and  mastication   . Swallowing difficulty   . Stricture and stenosis of esophagus   . Gastritis   . Vitamin D deficiency 11/18/2009  . Acid reflux 05/23/2009  . Cardiac murmur 05/23/2009    Past Surgical History:  Procedure Laterality Date  . COLONOSCOPY    . ESOPHAGEAL DILATION N/A 01/27/2018   Procedure: ESOPHAGEAL DILATION;  Surgeon: [name], MD;  Location: [place];  Service: Endoscopy;  Laterality: N/A;  . ESOPHAGOGASTRODUODENOSCOPY    . ESOPHAGOGASTRODUODENOSCOPY (EGD) WITH PROPOFOL N/A 01/18/2015   Procedure: ESOPHAGOGASTRODUODENOSCOPY (EGD) WITH PROPOFOL with dialtion;  Surgeon: [name], MD;  Location: [facility];  Service: Endoscopy;  Laterality: N/A;  . ESOPHAGOGASTRODUODENOSCOPY (EGD) WITH PROPOFOL N/A 05/17/2015    Procedure: ESOPHAGOGASTRODUODENOSCOPY (EGD) WITH PROPOFOL, WITH DIALATION;  Surgeon: [name], MD;  Location: [facility];  Service: Endoscopy;  Laterality: N/A;  . ESOPHAGOGASTRODUODENOSCOPY (EGD) WITH PROPOFOL N/A 12/10/2017   Procedure: ESOPHAGOGASTRODUODENOSCOPY (EGD) WITH PROPOFOL;  Surgeon: [name], MD;  Location: [facility];  Service: Endoscopy;  Laterality: N/A;  . ESOPHAGOGASTRODUODENOSCOPY (EGD) WITH PROPOFOL N/A 01/27/2018   Procedure: ESOPHAGOGASTRODUODENOSCOPY (EGD) WITH PROPOFOL;  Surgeon: [name], MD;  Location: [facility];  Service: Endoscopy;  Laterality: N/A;  . GANGLION CYST EXCISION Left    wrist  . TONSILLECTOMY    . VASECTOMY      Family History        Family Status  Relation Name Status  . Mother  Alive  . Father  Deceased  . Sister  Alive  . Brother  Alive  . Brother  Alive        His family history includes Cancer (age of onset: 38) in his father; Healthy in his brother, brother, and sister; Hyperlipidemia in his mother.      No Known Allergies   Current Outpatient Medications:  .  aspirin 81 MG tablet, Take 81 mg by mouth daily., Disp: , Rfl:  .  mometasone (NASONEX) 50 MCG/ACT nasal spray, Place 2 sprays into the nose daily., Disp: 17 g, Rfl: 12 .  omeprazole (PRILOSEC) 40 MG capsule, TAKE 1 CAPSULE BY MOUTH  EVERY DAY, Disp: 90 capsule, Rfl: 3   Patient Care Team: Jerrol Banana., MD as PCP - General (Family Medicine)    Objective:    Vitals: BP 118/76   Pulse 60   Temp 98 F (36.7 C)   Resp 16   Ht 5\' 10"  (1.778 m)   Wt 221 lb (100.2 kg)   SpO2 98%   BMI 31.71 kg/m    Vitals:   01/04/19 1033  BP: 118/76  Pulse: 60  Resp: 16  Temp: 98 F (36.7 C)  SpO2: 98%  Weight: 221 lb (100.2 kg)  Height: 5\' 10"  (1.778 m)     Physical Exam   Depression Screen PHQ 2/9 Scores 01/04/2019 09/15/2017 09/08/2016  PHQ - 2 Score 0 0 1  PHQ- 9 Score - 3 4       Assessment & Plan:     Routine  Health Maintenance and Physical Exam  Exercise Activities and Dietary recommendations Goals   None     Immunization History  Administered Date(s) Administered  . Tdap 06/23/2010    Health Maintenance  Topic Date Due  . HIV Screening  01/12/1976  . INFLUENZA VACCINE  02/11/2019  . TETANUS/TDAP  06/23/2020  . COLONOSCOPY  09/02/2021  . Hepatitis C Screening  Completed     Discussed health benefits of physical activity, and  encouraged him to engage in regular exercise appropriate for his age and condition.  1. Annual physical exam Discussed Lifestyle. - CBC with Differential/Platelet - Comprehensive metabolic panel - Lipid panel - TSH - POCT urinalysis dipstick  2. Prostate cancer screening  - PSA  3. Obstructive sleep apnea treated with continuous positive airway pressure (CPAP) On CPAP.  4. Gastroesophageal reflux disease, esophagitis presence not specified   5. Stricture and stenosis of esophagus Treated by GI.  6. Adult hypothyroidism   7. Class 1 obesity due to excess calories with serious comorbidity and body mass index (BMI) of 31.0 to 31.9 in adult With OSA/HLD   I have done the exam and reviewed the above chart and it is accurate to the best of my knowledge. Development worker, community has been used in this note in any air is in the dictation or transcription are unintentional.  Wilhemena Durie, MD  [facility]

## 2019-01-10 LAB — LIPID PANEL

## 2019-01-11 LAB — CBC WITH DIFFERENTIAL/PLATELET
Basophils Absolute: 0.1 10*3/uL (ref 0.0–0.2)
Basos: 1 %
EOS (ABSOLUTE): 0.2 10*3/uL (ref 0.0–0.4)
Eos: 3 %
Hematocrit: 45.5 % (ref 37.5–51.0)
Hemoglobin: 15.4 g/dL (ref 13.0–17.7)
Immature Grans (Abs): 0.1 10*3/uL (ref 0.0–0.1)
Immature Granulocytes: 1 %
Lymphocytes Absolute: 1.6 10*3/uL (ref 0.7–3.1)
Lymphs: 18 %
MCH: 31.5 pg (ref 26.6–33.0)
MCHC: 33.8 g/dL (ref 31.5–35.7)
MCV: 93 fL (ref 79–97)
Monocytes Absolute: 1 10*3/uL — ABNORMAL HIGH (ref 0.1–0.9)
Monocytes: 12 %
Neutrophils Absolute: 5.7 10*3/uL (ref 1.4–7.0)
Neutrophils: 65 %
Platelets: 312 10*3/uL (ref 150–450)
RBC: 4.89 x10E6/uL (ref 4.14–5.80)
RDW: 13.2 % (ref 11.6–15.4)
WBC: 8.7 10*3/uL (ref 3.4–10.8)

## 2019-01-11 LAB — COMPREHENSIVE METABOLIC PANEL
ALT: 35 IU/L (ref 0–44)
AST: 24 IU/L (ref 0–40)
Albumin/Globulin Ratio: 1.7 (ref 1.2–2.2)
Albumin: 4.3 g/dL (ref 3.8–4.9)
Alkaline Phosphatase: 41 IU/L (ref 39–117)
BUN/Creatinine Ratio: 13 (ref 9–20)
BUN: 14 mg/dL (ref 6–24)
Bilirubin Total: 0.5 mg/dL (ref 0.0–1.2)
CO2: 24 mmol/L (ref 20–29)
Calcium: 9.7 mg/dL (ref 8.7–10.2)
Chloride: 103 mmol/L (ref 96–106)
Creatinine, Ser: 1.09 mg/dL (ref 0.76–1.27)
GFR calc Af Amer: 87 mL/min/{1.73_m2} (ref >59)
GFR calc non Af Amer: 75 mL/min/{1.73_m2} (ref >59)
Globulin, Total: 2.6 g/dL (ref 1.5–4.5)
Glucose: 90 mg/dL (ref 65–99)
Potassium: 4.5 mmol/L (ref 3.5–5.2)
Sodium: 140 mmol/L (ref 134–144)
Total Protein: 6.9 g/dL (ref 6.0–8.5)

## 2019-01-11 LAB — LIPID PANEL
Chol/HDL Ratio: 5 ratio (ref 0.0–5.0)
Cholesterol, Total: 206 mg/dL — ABNORMAL HIGH (ref 100–199)
HDL: 41 mg/dL (ref >39)
LDL Calculated: 134 mg/dL — ABNORMAL HIGH (ref 0–99)
Triglycerides: 155 mg/dL — ABNORMAL HIGH (ref 0–149)
VLDL Cholesterol Cal: 31 mg/dL (ref 5–40)

## 2019-01-11 LAB — TSH: TSH: 5.05 u[IU]/mL — ABNORMAL HIGH (ref 0.450–4.500)

## 2019-01-11 LAB — PSA: Prostate Specific Ag, Serum: 4 ng/mL (ref 0.0–4.0)

## 2019-01-12 ENCOUNTER — Telehealth: Payer: Self-pay

## 2019-01-12 NOTE — Telephone Encounter (Signed)
Left message for patient to call regarding labs.

## 2019-01-12 NOTE — Telephone Encounter (Signed)
-----   Message from Jerrol Banana., MD sent at 01/11/2019 12:00 PM EDT ----- Labs stable--borderline hypothyroid--RTC 3-5 months .

## 2019-01-12 NOTE — Telephone Encounter (Signed)
Patient advised as directed below and scheduled for follow-up

## 2019-03-07 ENCOUNTER — Other Ambulatory Visit: Payer: Self-pay | Admitting: Family Medicine

## 2019-04-26 ENCOUNTER — Other Ambulatory Visit: Payer: Self-pay

## 2019-04-26 ENCOUNTER — Encounter: Payer: Self-pay | Admitting: Family Medicine

## 2019-04-26 ENCOUNTER — Ambulatory Visit (INDEPENDENT_AMBULATORY_CARE_PROVIDER_SITE_OTHER): Payer: Managed Care, Other (non HMO) | Admitting: Family Medicine

## 2019-04-26 VITALS — BP 120/70 | HR 54 | Temp 96.9°F | Resp 16 | Wt 211.0 lb

## 2019-04-26 DIAGNOSIS — G4733 Obstructive sleep apnea (adult) (pediatric): Secondary | ICD-10-CM

## 2019-04-26 DIAGNOSIS — K219 Gastro-esophageal reflux disease without esophagitis: Secondary | ICD-10-CM

## 2019-04-26 DIAGNOSIS — Z9989 Dependence on other enabling machines and devices: Secondary | ICD-10-CM

## 2019-04-26 DIAGNOSIS — E039 Hypothyroidism, unspecified: Secondary | ICD-10-CM | POA: Diagnosis not present

## 2019-04-26 NOTE — Progress Notes (Signed)
Patient: Christopher Parsons Male    DOB: 01-May-1961   58 y.o.   MRN: [id number] Visit Date: 04/26/2019  Today's Provider: Wilhemena Durie, MD   Chief Complaint  Patient presents with  . Follow-up   Subjective:     HPI  Follow up for Hypothyroidism:  The patient was last seen for this 4 months ago. Changes made at last visit include none.  He reports good compliance with treatment. He feels that condition is Unchanged. He is not having side effects.   ------------------------------------------------------------------------------------  No Known Allergies   Current Outpatient Medications:  .  aspirin 81 MG tablet, Take 81 mg by mouth daily., Disp: , Rfl:  .  mometasone (NASONEX) 50 MCG/ACT nasal spray, Place 2 sprays into the nose daily., Disp: 17 g, Rfl: 12 .  omeprazole (PRILOSEC) 40 MG capsule, TAKE 1 CAPSULE BY MOUTH  EVERY DAY, Disp: 90 capsule, Rfl: 3  Review of Systems  Constitutional: Negative for appetite change, chills and fever.  HENT: Negative.   Eyes: Negative.   Respiratory: Negative for chest tightness, shortness of breath and wheezing.   Cardiovascular: Negative for chest pain and palpitations.  Gastrointestinal: Negative for abdominal pain, nausea and vomiting.  Endocrine: Negative.   Musculoskeletal: Negative.   Allergic/Immunologic: Negative.   Neurological: Negative.   Hematological: Negative.   Psychiatric/Behavioral: Negative.     Social History   Tobacco Use  . Smoking status: Never Smoker  . Smokeless tobacco: Never Used  Substance Use Topics  . Alcohol use: Yes    Alcohol/week: 5.0 standard drinks    Types: 5 Shots of liquor per week    Comment: 1-2 drinks daily      Objective:   BP 120/70 (BP Location: Left Arm, Patient Position: Sitting, Cuff Size: Large)   Pulse (!) 54   Temp (!) 96.9 F (36.1 C) (Temporal)   Resp 16   Wt 211 lb (95.7 kg)   SpO2 98% Comment: room air  BMI 30.28 kg/m  Vitals:   04/26/19 0810   BP: 120/70  Pulse: (!) 54  Resp: 16  Temp: (!) 96.9 F (36.1 C)  TempSrc: Temporal  SpO2: 98%  Weight: 211 lb (95.7 kg)  Body mass index is 30.28 kg/m.   Physical Exam Vitals signs reviewed.  Constitutional:      Appearance: He is well-developed.  HENT:     Head: Normocephalic and atraumatic.     Right Ear: External ear normal.     Left Ear: External ear normal.     Nose: Nose normal.  Eyes:     General: No scleral icterus.    Conjunctiva/sclera: Conjunctivae normal.  Neck:     Thyroid: No thyromegaly.  Cardiovascular:     Rate and Rhythm: Normal rate and regular rhythm.     Heart sounds: Normal heart sounds.  Pulmonary:     Effort: Pulmonary effort is normal.     Breath sounds: Normal breath sounds.  Abdominal:     Palpations: Abdomen is soft.  Genitourinary:    Penis: Normal.      Prostate: Normal.     Rectum: Normal.  Musculoskeletal: Normal range of motion.  Lymphadenopathy:     Cervical: No cervical adenopathy.  Skin:    General: Skin is warm and dry.  Neurological:     General: No focal deficit present.     Mental Status: He is alert and oriented to person, place, and time.  Psychiatric:  Mood and Affect: Mood normal.        Behavior: Behavior normal.        Thought Content: Thought content normal.        Judgment: Judgment normal.      No results found for any visits on 04/26/19.     Assessment & Plan    1. Adult hypothyroidism  - TSH  2. Obstructive sleep apnea treated with continuous positive airway pressure (CPAP)  On CPAP.  3. Gastroesophageal reflux disease, unspecified whether esophagitis present      Wilhemena Durie, MD  [facility]

## 2019-04-28 LAB — TSH: TSH: 2.91 u[IU]/mL (ref 0.450–4.500)

## 2019-05-01 ENCOUNTER — Ambulatory Visit: Payer: Managed Care, Other (non HMO) | Admitting: Neurology

## 2019-05-01 ENCOUNTER — Other Ambulatory Visit: Payer: Self-pay

## 2019-05-01 ENCOUNTER — Encounter: Payer: Self-pay | Admitting: Neurology

## 2019-05-01 VITALS — BP 124/86 | HR 76 | Temp 97.9°F | Ht 70.0 in | Wt 210.0 lb

## 2019-05-01 DIAGNOSIS — Z9989 Dependence on other enabling machines and devices: Secondary | ICD-10-CM | POA: Diagnosis not present

## 2019-05-01 DIAGNOSIS — G4730 Sleep apnea, unspecified: Secondary | ICD-10-CM

## 2019-05-01 DIAGNOSIS — G4710 Hypersomnia, unspecified: Secondary | ICD-10-CM | POA: Diagnosis not present

## 2019-05-01 DIAGNOSIS — G4733 Obstructive sleep apnea (adult) (pediatric): Secondary | ICD-10-CM | POA: Diagnosis not present
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
## 2019-09-23 ENCOUNTER — Ambulatory Visit: Payer: Managed Care, Other (non HMO) | Attending: Internal Medicine

## 2019-09-23 DIAGNOSIS — Z23 Encounter for immunization: Secondary | ICD-10-CM

## 2019-09-23 NOTE — Progress Notes (Signed)
   Covid-19 Vaccination Clinic  Name:  Christopher Parsons    MRN: [id number] DOB: Sep 23, 1960  09/23/2019  Mr. Christopher Parsons was observed post Covid-19 immunization for 15 minutes without incident. He was provided with Vaccine Information Sheet and instruction to access the V-Safe system.   Mr. Christopher Parsons was instructed to call 911 with any severe reactions post vaccine: [place] Kitchen Difficulty breathing  . Swelling of face and throat  . A fast heartbeat  . A bad rash all over body  . Dizziness and weakness   Immunizations Administered    Name Date Dose VIS Date Route   Pfizer COVID-19 Vaccine 09/23/2019  8:49 AM 0.3 mL 06/23/2019 Intramuscular   Manufacturer: [place]   Lot: [id number]   [place]: [id number]

## 2019-10-17 ENCOUNTER — Ambulatory Visit: Payer: Managed Care, Other (non HMO) | Attending: Internal Medicine

## 2019-10-17 DIAGNOSIS — Z23 Encounter for immunization: Secondary | ICD-10-CM

## 2019-10-17 NOTE — Progress Notes (Signed)
   Covid-19 Vaccination Clinic  Name:  Christopher Parsons    MRN: [id number] DOB: [birth year]  10/17/2019  Christopher Parsons was observed post Covid-19 immunization for 15 minutes without incident. He was provided with Vaccine Information Sheet and instruction to access the V-Safe system.   Christopher Parsons was instructed to call 911 with any severe reactions post vaccine: Marland Kitchen Difficulty breathing  . Swelling of face and throat  . A fast heartbeat  . A bad rash all over body  . Dizziness and weakness   Immunizations Administered    Name Date Dose VIS Date Route   Pfizer COVID-19 Vaccine 10/17/2019 10:56 AM 0.3 mL 06/23/2019 Intramuscular   Manufacturer: [place]   Lot: [id number]   [place]: [id number]

## 2020-01-08 NOTE — Progress Notes (Signed)
Trena Platt Cummings,acting as a scribe for Wilhemena Durie, MD.,have documented all relevant documentation on the behalf of Christopher Saber, MD,as directed by  Wilhemena Durie, MD while in the presence of Wilhemena Durie, MD.  Complete physical exam   Patient: Christopher Parsons   DOB: [birth year]   59 y.o. Male  MRN: [id number] Visit Date: 01/10/2020  Today's healthcare provider: Wilhemena Durie, MD   Chief Complaint  Patient presents with   Annual Exam   Subjective    [name] is a 59 y.o. male who presents today for a complete physical exam.  He reports consuming a general diet. Home exercise routine includes walking. He generally feels well. He reports sleeping fairly well. He does not have additional problems to discuss today.   HPI    Past Medical History:  Diagnosis Date   GERD (gastroesophageal reflux disease)    Hypothyroidism    Mitral valve prolapse    echo 12/14 on paper chart   OSA on CPAP    Past Surgical History:  Procedure Laterality Date   COLONOSCOPY     ESOPHAGEAL DILATION N/A 01/27/2018   Procedure: ESOPHAGEAL DILATION;  Surgeon: [name], MD;  Location: [place];  Service: Endoscopy;  Laterality: N/A;   ESOPHAGOGASTRODUODENOSCOPY     ESOPHAGOGASTRODUODENOSCOPY (EGD) WITH PROPOFOL N/A 01/18/2015   Procedure: ESOPHAGOGASTRODUODENOSCOPY (EGD) WITH PROPOFOL with dialtion;  Surgeon: [name], MD;  Location: [place];  Service: Endoscopy;  Laterality: N/A;   ESOPHAGOGASTRODUODENOSCOPY (EGD) WITH PROPOFOL N/A 05/17/2015   Procedure: ESOPHAGOGASTRODUODENOSCOPY (EGD) WITH PROPOFOL, WITH DIALATION;  Surgeon: [name], MD;  Location: [place];  Service: Endoscopy;  Laterality: N/A;   ESOPHAGOGASTRODUODENOSCOPY (EGD) WITH PROPOFOL N/A 12/10/2017   Procedure: ESOPHAGOGASTRODUODENOSCOPY (EGD) WITH PROPOFOL;  Surgeon: [name], MD;  Location: [place];  Service: Endoscopy;   Laterality: N/A;   ESOPHAGOGASTRODUODENOSCOPY (EGD) WITH PROPOFOL N/A 01/27/2018   Procedure: ESOPHAGOGASTRODUODENOSCOPY (EGD) WITH PROPOFOL;  Surgeon: [name], MD;  Location: [place];  Service: Endoscopy;  Laterality: N/A;   GANGLION CYST EXCISION Left    wrist   TONSILLECTOMY     VASECTOMY     Social History   Socioeconomic History   Marital status: Married    Spouse name: Not on file   Number of children: 3   Years of education: 12   Highest education level: Not on file  Occupational History   Not on file  Tobacco Use   Smoking status: Never Smoker   Smokeless tobacco: Never Used  Vaping Use   Vaping Use: Never used  Substance and Sexual Activity   Alcohol use: Yes    Alcohol/week: 5.0 standard drinks    Types: 5 Shots of liquor per week    Comment: 1-2 drinks daily   Drug use: No   Sexual activity: Yes    Birth control/protection: None    Comment: vasectomy  Other Topics Concern   Not on file  Social History Narrative   Not on file   Social Determinants of Health   Financial Resource Strain:    Difficulty of Paying Living Expenses:   Food Insecurity:    Worried About Charity fundraiser in the Last Year:    Arboriculturist in the Last Year:   Transportation Needs:    Film/video editor (Medical):    Lack of Transportation (Non-Medical):   Physical Activity:    Days of Exercise per Week:  Minutes of Exercise per Session:   Stress:    Feeling of Stress :   Social Connections:    Frequency of Communication with Friends and Family:    Frequency of Social Gatherings with Friends and Family:    Attends Religious Services:    Active Member of Clubs or Organizations:    Attends Music therapist:    Marital Status:   Intimate Partner Violence:    Fear of Current or Ex-Partner:    Emotionally Abused:    Physically Abused:    Sexually Abused:    Family Status  Relation Name Status    Mother  Alive   Father  Deceased   Sister  Alive   Brother  Alive   Brother  Alive   Family History  Problem Relation Age of Onset   Hyperlipidemia Mother    Cancer Father 89       lung cancer   Healthy Sister    Healthy Brother    Healthy Brother    No Known Allergies  Patient Care Team: Jerrol Banana., MD as PCP - General (Family Medicine)   Medications: Outpatient Medications Prior to Visit  Medication Sig   aspirin 81 MG tablet Take 81 mg by mouth daily.   mometasone (NASONEX) 50 MCG/ACT nasal spray Place 2 sprays into the nose daily.   omeprazole (PRILOSEC) 40 MG capsule TAKE 1 CAPSULE BY MOUTH  EVERY DAY   No facility-administered medications prior to visit.    Review of Systems  Constitutional: Negative.   HENT: Negative.   Eyes: Negative.   Respiratory: Positive for apnea.   Cardiovascular: Negative.   Gastrointestinal: Negative.   Endocrine: Negative.   Genitourinary: Positive for testicular pain.       Left testicular discomfort at times for the past year.  It did improve with antibiotics patient thinks  Musculoskeletal: Negative.   Skin: Negative.   Allergic/Immunologic: Negative.   Neurological: Negative.   Hematological: Negative.   Psychiatric/Behavioral: Negative.        Objective    BP 121/73 (BP Location: Right Arm, Patient Position: Sitting, Cuff Size: Large)    Pulse (!) 55    Temp (!) 97.1 F (36.2 C) (Temporal)    Ht 5\' 10"  (1.778 m)    Wt 206 lb 6.4 oz (93.6 kg)    BMI 29.62 kg/m  BP Readings from Last 3 Encounters:  01/10/20 121/73  05/01/19 124/86  04/26/19 120/70   Wt Readings from Last 3 Encounters:  01/10/20 206 lb 6.4 oz (93.6 kg)  05/01/19 210 lb (95.3 kg)  04/26/19 211 lb (95.7 kg)      Physical Exam Vitals reviewed.  Constitutional:      Appearance: Normal appearance.  HENT:     Head: Normocephalic and atraumatic.     Right Ear: Tympanic membrane and external ear normal.     Left Ear: Tympanic  membrane and external ear normal.     Nose: Nose normal.     Mouth/Throat:     Pharynx: Oropharynx is clear.  Eyes:     Conjunctiva/sclera: Conjunctivae normal.  Neck:     Vascular: No carotid bruit.  Cardiovascular:     Rate and Rhythm: Normal rate and regular rhythm.     Pulses: Normal pulses.     Heart sounds: Normal heart sounds.  Pulmonary:     Effort: Pulmonary effort is normal.     Breath sounds: Normal breath sounds.  Abdominal:     Palpations:  Abdomen is soft.  Genitourinary:    Penis: Normal.      Testes: Normal.     Prostate: Normal.     Rectum: Normal.     Comments: Left testicle mildly tender in the area of the epididymis, no mass-effect Musculoskeletal:     Right lower leg: No edema.     Left lower leg: No edema.  Lymphadenopathy:     Cervical: No cervical adenopathy.  Skin:    General: Skin is warm and dry.  Neurological:     General: No focal deficit present.     Mental Status: He is alert and oriented to person, place, and time.  Psychiatric:        Mood and Affect: Mood normal.        Behavior: Behavior normal.        Thought Content: Thought content normal.        Judgment: Judgment normal.       Last depression screening scores PHQ 2/9 Scores 04/26/2019 01/04/2019 09/15/2017  PHQ - 2 Score 0 0 0  PHQ- 9 Score - - 3   Last fall risk screening Fall Risk  09/08/2016  Falls in the past year? No   Last Audit-C alcohol use screening Alcohol Use Disorder Test (AUDIT) 01/04/2019  1. How often do you have a drink containing alcohol? 4  2. How many drinks containing alcohol do you have on a typical day when you are drinking? 0  3. How often do you have six or more drinks on one occasion? 0  AUDIT-C Score 4  4. How often during the last year have you found that you were not able to stop drinking once you had started? 0  5. How often during the last year have you failed to do what was normally expected from you because of drinking? 0  6. How often during  the last year have you needed a first drink in the morning to get yourself going after a heavy drinking session? 0  8. How often during the last year have you been unable to remember what happened the night before because you had been drinking? 0  9. Have you or someone else been injured as a result of your drinking? 0  10. Has a relative or friend or a doctor or another health worker been concerned about your drinking or suggested you cut down? 0  Alcohol Use Disorder Identification Test Final Score (AUDIT) 4  Alcohol Brief Interventions/Follow-up AUDIT Score <7 follow-up not indicated   A score of 3 or more in women, and 4 or more in men indicates increased risk for alcohol abuse, EXCEPT if all of the points are from question 1   No results found for any visits on 01/10/20.  Assessment & Plan    Routine Health Maintenance and Physical Exam  Exercise Activities and Dietary recommendations Goals   None     Immunization History  Administered Date(s) Administered   PFIZER SARS-COV-2 Vaccination 09/23/2019, 10/17/2019   Tdap 06/23/2010    Health Maintenance  Topic Date Due   HIV Screening  Never done   INFLUENZA VACCINE  02/11/2020   TETANUS/TDAP  06/23/2020   COLONOSCOPY  09/02/2021   COVID-19 Vaccine  Completed   Hepatitis C Screening  Completed    Discussed health benefits of physical activity, and encouraged him to engage in regular exercise appropriate for his age and condition.  1. Annual physical exam  - TSH - Lipid panel - Comprehensive metabolic panel -  CBC with Differential/Platelet - POCT urinalysis dipstick  2. Adult hypothyroidism   3. Gastroesophageal reflux disease, unspecified whether esophagitis present   4. Prostate cancer screening  - PSA  5. Class 1 obesity due to excess calories with serious comorbidity and body mass index (BMI) of 31.0 to 31.9 in adult   6. Screening for blood or protein in urine   7. Encounter for screening  fecal occult blood testing  - IFOBT POC (occult bld, rslt in office); Future - IFOBT POC (occult bld, rslt in office)  8. Pain in left testicle Could be epididymitis but this is been going on intermittently for a year.  He also has had a borderline PSA with a year ago PSA being 22 and a [age]-year-old - Ambulatory referral to Urology   No follow-ups on file.     I, Wilhemena Durie, MD, have reviewed all documentation for this visit. The documentation on 01/19/20 for the exam, diagnosis, procedures, and orders are all accurate and complete.    Brevin Cranford Mon, MD  [facility] [phone] (phone) [phone] (fax)  [place]

## 2020-01-10 ENCOUNTER — Encounter: Payer: Self-pay | Admitting: Family Medicine

## 2020-01-10 ENCOUNTER — Ambulatory Visit (INDEPENDENT_AMBULATORY_CARE_PROVIDER_SITE_OTHER): Payer: Managed Care, Other (non HMO) | Admitting: Family Medicine

## 2020-01-10 ENCOUNTER — Other Ambulatory Visit: Payer: Self-pay

## 2020-01-10 VITALS — BP 121/73 | HR 55 | Temp 97.1°F | Ht 70.0 in | Wt 206.4 lb

## 2020-01-10 DIAGNOSIS — E039 Hypothyroidism, unspecified: Secondary | ICD-10-CM

## 2020-01-10 DIAGNOSIS — N50812 Left testicular pain: Secondary | ICD-10-CM

## 2020-01-10 DIAGNOSIS — Z6831 Body mass index (BMI) 31.0-31.9, adult: Secondary | ICD-10-CM

## 2020-01-10 DIAGNOSIS — Z1211 Encounter for screening for malignant neoplasm of colon: Secondary | ICD-10-CM

## 2020-01-10 DIAGNOSIS — Z125 Encounter for screening for malignant neoplasm of prostate: Secondary | ICD-10-CM

## 2020-01-10 DIAGNOSIS — Z1389 Encounter for screening for other disorder: Secondary | ICD-10-CM

## 2020-01-10 DIAGNOSIS — E6609 Other obesity due to excess calories: Secondary | ICD-10-CM

## 2020-01-10 DIAGNOSIS — Z0000 Encounter for general adult medical examination without abnormal findings: Secondary | ICD-10-CM | POA: Diagnosis not present

## 2020-01-10 DIAGNOSIS — K219 Gastro-esophageal reflux disease without esophagitis: Secondary | ICD-10-CM | POA: Diagnosis not present

## 2020-01-10 LAB — POCT URINALYSIS DIPSTICK
Bilirubin, UA: NEGATIVE
Glucose, UA: NEGATIVE
Ketones, UA: NEGATIVE
Leukocytes, UA: NEGATIVE
Nitrite, UA: NEGATIVE
Protein, UA: POSITIVE — AB
Spec Grav, UA: 1.01 (ref 1.010–1.025)
Urobilinogen, UA: 0.2 E.U./dL
pH, UA: 7.5 (ref 5.0–8.0)

## 2020-01-10 LAB — IFOBT (OCCULT BLOOD): IFOBT: NEGATIVE

## 2020-01-13 LAB — CBC WITH DIFFERENTIAL/PLATELET
Basophils Absolute: 0.1 10*3/uL (ref 0.0–0.2)
Basos: 1 %
EOS (ABSOLUTE): 0.2 10*3/uL (ref 0.0–0.4)
Eos: 3 %
Hematocrit: 44.9 % (ref 37.5–51.0)
Hemoglobin: 15.5 g/dL (ref 13.0–17.7)
Immature Grans (Abs): 0 10*3/uL (ref 0.0–0.1)
Immature Granulocytes: 0 %
Lymphocytes Absolute: 1.3 10*3/uL (ref 0.7–3.1)
Lymphs: 17 %
MCH: 31.3 pg (ref 26.6–33.0)
MCHC: 34.5 g/dL (ref 31.5–35.7)
MCV: 91 fL (ref 79–97)
Monocytes Absolute: 0.9 10*3/uL (ref 0.1–0.9)
Monocytes: 12 %
Neutrophils Absolute: 5.1 10*3/uL (ref 1.4–7.0)
Neutrophils: 67 %
Platelets: 319 10*3/uL (ref 150–450)
RBC: 4.95 x10E6/uL (ref 4.14–5.80)
RDW: 12.4 % (ref 11.6–15.4)
WBC: 7.5 10*3/uL (ref 3.4–10.8)

## 2020-01-13 LAB — COMPREHENSIVE METABOLIC PANEL
ALT: 22 IU/L (ref 0–44)
AST: 19 IU/L (ref 0–40)
Albumin/Globulin Ratio: 1.7 (ref 1.2–2.2)
Albumin: 4.5 g/dL (ref 3.8–4.9)
Alkaline Phosphatase: 44 IU/L — ABNORMAL LOW (ref 48–121)
BUN/Creatinine Ratio: 15 (ref 9–20)
BUN: 16 mg/dL (ref 6–24)
Bilirubin Total: 0.4 mg/dL (ref 0.0–1.2)
CO2: 22 mmol/L (ref 20–29)
Calcium: 9.8 mg/dL (ref 8.7–10.2)
Chloride: 103 mmol/L (ref 96–106)
Creatinine, Ser: 1.05 mg/dL (ref 0.76–1.27)
GFR calc Af Amer: 89 mL/min/{1.73_m2} (ref >59)
GFR calc non Af Amer: 77 mL/min/{1.73_m2} (ref >59)
Globulin, Total: 2.6 g/dL (ref 1.5–4.5)
Glucose: 97 mg/dL (ref 65–99)
Potassium: 4.6 mmol/L (ref 3.5–5.2)
Sodium: 139 mmol/L (ref 134–144)
Total Protein: 7.1 g/dL (ref 6.0–8.5)

## 2020-01-13 LAB — PSA: Prostate Specific Ag, Serum: 4.3 ng/mL — ABNORMAL HIGH (ref 0.0–4.0)

## 2020-01-13 LAB — LIPID PANEL
Chol/HDL Ratio: 4.6 ratio (ref 0.0–5.0)
Cholesterol, Total: 214 mg/dL — ABNORMAL HIGH (ref 100–199)
HDL: 47 mg/dL (ref >39)
LDL Chol Calc (NIH): 156 mg/dL — ABNORMAL HIGH (ref 0–99)
Triglycerides: 62 mg/dL (ref 0–149)
VLDL Cholesterol Cal: 11 mg/dL (ref 5–40)

## 2020-01-13 LAB — TSH: TSH: 2.88 u[IU]/mL (ref 0.450–4.500)

## 2020-01-16 ENCOUNTER — Telehealth: Payer: Self-pay

## 2020-01-16 NOTE — Telephone Encounter (Signed)
Patient advised of lab results through mychart and has read the providers comments.  °

## 2020-01-16 NOTE — Telephone Encounter (Signed)
-----   Message from Jerrol Banana., MD sent at 01/13/2020  8:43 AM EDT ----- Labs in normal range.

## 2020-01-19 ENCOUNTER — Encounter: Payer: Self-pay | Admitting: Urology

## 2020-01-19 ENCOUNTER — Ambulatory Visit (INDEPENDENT_AMBULATORY_CARE_PROVIDER_SITE_OTHER): Payer: Managed Care, Other (non HMO) | Admitting: Urology

## 2020-01-19 ENCOUNTER — Other Ambulatory Visit: Payer: Self-pay

## 2020-01-19 VITALS — BP 147/95 | HR 64 | Ht 70.0 in | Wt 206.2 lb

## 2020-01-19 DIAGNOSIS — R3129 Other microscopic hematuria: Secondary | ICD-10-CM

## 2020-01-19 DIAGNOSIS — R972 Elevated prostate specific antigen [PSA]: Secondary | ICD-10-CM | POA: Diagnosis not present

## 2020-01-19 LAB — URINALYSIS, COMPLETE
Bilirubin, UA: NEGATIVE
Glucose, UA: NEGATIVE
Ketones, UA: NEGATIVE
Leukocytes,UA: NEGATIVE
Nitrite, UA: NEGATIVE
Protein,UA: NEGATIVE
Specific Gravity, UA: 1.015 (ref 1.005–1.030)
Urobilinogen, Ur: 0.2 mg/dL (ref 0.2–1.0)
pH, UA: 6 (ref 5.0–7.5)

## 2020-01-19 LAB — MICROSCOPIC EXAMINATION
Bacteria, UA: NONE SEEN
Epithelial Cells (non renal): NONE SEEN /hpf (ref 0–10)

## 2020-01-19 NOTE — Progress Notes (Signed)
01/19/2020 2:47 PM   Christopher Parsons Sep 10, 1960 [id number]  Referring provider: Jerrol Banana., MD [address],  [place] [address]  CC: Elevated PSA    HPI:  Christopher Parsons was referred over for PSA elevation.  His PSA has been a little bit high for his age over the past few years and at last check was 4.3. PSA history: 01/17 PSA 3.1 03/18 PSA 3.7 03/19 PSA 3.9 06/20 PSA 4 07/21 PSA 4.3  PSAV 0.3 / year. No FH of PCa. Dad had kidney cancer.   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 Has left testicle pain on left. 2-3 yrs. He has some hesitancy and weak stream. MH on UA today - 3-10 rbc. 6-10 wbc. No bacteria.    PMH: Past Medical History:  Diagnosis Date  . GERD (gastroesophageal reflux disease)   . Hypothyroidism   . Mitral valve prolapse    echo 12/14 on paper chart  . OSA on CPAP     Surgical History: Past Surgical History:  Procedure Laterality Date  . COLONOSCOPY    . ESOPHAGEAL DILATION N/A 01/27/2018   Procedure: ESOPHAGEAL DILATION;  Surgeon: [name], MD;  Location: [place];  Service: Endoscopy;  Laterality: N/A;  . ESOPHAGOGASTRODUODENOSCOPY    . ESOPHAGOGASTRODUODENOSCOPY (EGD) WITH PROPOFOL N/A 01/18/2015   Procedure: ESOPHAGOGASTRODUODENOSCOPY (EGD) WITH PROPOFOL with dialtion;  Surgeon: [name], MD;  Location: [place];  Service: Endoscopy;  Laterality: N/A;  . ESOPHAGOGASTRODUODENOSCOPY (EGD) WITH PROPOFOL N/A 05/17/2015   Procedure: ESOPHAGOGASTRODUODENOSCOPY (EGD) WITH PROPOFOL, WITH DIALATION;  Surgeon: [name], MD;  Location: [place];  Service: Endoscopy;  Laterality: N/A;  . ESOPHAGOGASTRODUODENOSCOPY (EGD) WITH PROPOFOL N/A 12/10/2017   Procedure: ESOPHAGOGASTRODUODENOSCOPY (EGD) WITH PROPOFOL;  Surgeon: [name], MD;  Location: [place];  Service: Endoscopy;  Laterality: N/A;  . ESOPHAGOGASTRODUODENOSCOPY (EGD) WITH PROPOFOL N/A 01/27/2018   Procedure: ESOPHAGOGASTRODUODENOSCOPY (EGD) WITH  PROPOFOL;  Surgeon: [name], MD;  Location: [place];  Service: Endoscopy;  Laterality: N/A;  . GANGLION CYST EXCISION Left    wrist  . TONSILLECTOMY    . VASECTOMY      Home Medications:  Allergies as of 01/19/2020   No Known Allergies     Medication List       Accurate as of January 19, 2020  2:47 PM. If you have any questions, ask your nurse or doctor.        aspirin 81 MG tablet Take 81 mg by mouth daily.   mometasone 50 MCG/ACT nasal spray Commonly known as: NASONEX Place 2 sprays into the nose daily.   omeprazole 40 MG capsule Commonly known as: PRILOSEC TAKE 1 CAPSULE BY MOUTH  EVERY DAY       Allergies: No Known Allergies  Family History: Family History  Problem Relation Age of Onset  . Hyperlipidemia Mother   . Cancer Father 40       lung cancer  . Healthy Sister   . Healthy Brother   . Healthy Brother     Social History:  reports that he has never smoked. He has never used smokeless tobacco. He reports current alcohol use of about 5.0 standard drinks of alcohol per week. He reports that he does not use drugs.   Physical Exam: There were no vitals taken for this visit.  Constitutional:  Alert and oriented, No acute distress. HEENT: [place] AT, moist mucus membranes.  Trachea midline, no masses. Cardiovascular: No clubbing, cyanosis, or edema. Respiratory: Normal respiratory  effort, no increased work of breathing. GI: Abdomen is soft, nontender, nondistended, no abdominal masses GU: No CVA tenderness Lymph: No cervical or inguinal lymphadenopathy. Skin: No rashes, bruises or suspicious lesions. Neurologic: Grossly intact, no focal deficits, moving all 4 extremities. Psychiatric: Normal mood and affect. GU: Penis circumcised, normal foreskin, testicles descended bilaterally and palpably normal, although some mild atrophy,  bilateral epididymis palpably normal, scrotum normal DRE: Prostate 30 g, smooth without hard area or  nodule   Laboratory Data: Lab Results  Component Value Date   WBC 7.5 01/12/2020   HGB 15.5 01/12/2020   HCT 44.9 01/12/2020   MCV 91 01/12/2020   PLT 319 01/12/2020    Lab Results  Component Value Date   CREATININE 1.05 01/12/2020    No results found for: PSA  No results found for: TESTOSTERONE  No results found for: HGBA1C  Urinalysis    Component Value Date/Time   BILIRUBINUR Negative 01/10/2020 1042   PROTEINUR Positive (A) 01/10/2020 1042   UROBILINOGEN 0.2 01/10/2020 1042   NITRITE Negative 01/10/2020 1042   LEUKOCYTESUR Negative 01/10/2020 1042    Lab Results  Component Value Date   WBCUA 0-5 07/03/2015   RBCUA 0-2 07/03/2015   LABEPIT None seen 07/03/2015   MUCUS Present 07/03/2015   BACTERIA None seen 07/03/2015    Pertinent Imaging: n/a No results found for this or any previous visit.  No results found for this or any previous visit.  No results found for this or any previous visit.  No results found for this or any previous visit.  No results found for this or any previous visit.  No results found for this or any previous visit.  No results found for this or any previous visit.  No results found for this or any previous visit.   Assessment & Plan:    1. Pain in left testicle Benign exam . He was reassured. We might check a T in the future as there is some atrophy.  - Urinalysis, Complete  2. MH - eval with Ct and cysto - check testicle, prostate, kidneys (FH kidney ca), etc.   3. PSA elevation - bordeline on elevation and PSAV. I had a long discussion with the patient on the nature of elevated PSA - benign vs malignant causes. We discussed age specific levels and that PCa can be seen on a biopsy with very low PSA levels (<=2.5). We discussed the nature risks and benefits of continued surveillance, other lab tests, imaging as well as prostate biopsy. We discussed the management of prostate cancer might include active surveillance or  treatment depending on biopsy findings. All questions answered. We will get CT as above and then consider PSAD f/u PSA and poss bx.   No follow-ups on file.  Festus Aloe, MD  [facility] [address] [place], [place] [address] [phone]

## 2020-01-19 NOTE — Patient Instructions (Signed)

## 2020-01-20 ENCOUNTER — Other Ambulatory Visit: Payer: Self-pay | Admitting: Family Medicine

## 2020-01-20 NOTE — Telephone Encounter (Signed)
Requested Prescriptions  Pending Prescriptions Disp Refills  . omeprazole (PRILOSEC) 40 MG capsule [Pharmacy Med Name: OMEPRAZOLE  40MG   CAP] 90 capsule 3    Sig: TAKE 1 CAPSULE BY MOUTH  DAILY     Gastroenterology: Proton Pump Inhibitors Passed - 01/20/2020  9:29 PM      Passed - Valid encounter within last 12 months    Recent Outpatient Visits          1 week ago Annual physical exam   [facility] Jerrol Banana., MD   8 months ago Adult hypothyroidism   [facility] Jerrol Banana., MD   1 year ago Annual physical exam   [facility] Jerrol Banana., MD   2 years ago Annual physical exam   [facility] Jerrol Banana., MD   3 years ago Annual physical exam   [facility] Jerrol Banana., MD      Future Appointments            In 1 month Festus Aloe, MD [place]   In 12 months Jerrol Banana., MD [facility] [place]

## 2020-01-21 ENCOUNTER — Encounter: Payer: Self-pay | Admitting: Neurology

## 2020-02-16 ENCOUNTER — Other Ambulatory Visit: Payer: Self-pay

## 2020-02-16 ENCOUNTER — Ambulatory Visit
Admission: RE | Admit: 2020-02-16 | Discharge: 2020-02-16 | Disposition: A | Discharge status: Home / Self Care | Payer: Managed Care, Other (non HMO) | Source: Ambulatory Visit | Attending: Urology | Admitting: Urology

## 2020-02-16 DIAGNOSIS — R3129 Other microscopic hematuria: Secondary | ICD-10-CM | POA: Diagnosis present

## 2020-02-16 DIAGNOSIS — R972 Elevated prostate specific antigen [PSA]: Secondary | ICD-10-CM | POA: Diagnosis present

## 2020-02-16 MED ORDER — IOHEXOL 300 MG/ML  SOLN
125.0000 mL | Freq: Once | INTRAMUSCULAR | Status: AC | PRN
  Administered 2020-02-16: 125 mL via INTRAVENOUS

## 2020-02-20 ENCOUNTER — Encounter: Payer: Self-pay | Admitting: Family Medicine

## 2020-02-20 ENCOUNTER — Telehealth: Payer: Self-pay

## 2020-02-20 NOTE — Telephone Encounter (Signed)
LVM for pt to call back Called about 04/30/20 appt

## 2020-02-21 NOTE — Telephone Encounter (Signed)
Attempted to call pt, LVM  

## 2020-02-21 NOTE — Telephone Encounter (Signed)
Pt has returned the call to [facility]. Please call

## 2020-02-26 NOTE — Telephone Encounter (Signed)
Pt has returned phone call from [facility]

## 2020-02-28 NOTE — Telephone Encounter (Signed)
Called and LVM to switch appt to [facility] visit

## 2020-03-01 ENCOUNTER — Other Ambulatory Visit: Payer: Self-pay | Admitting: Urology

## 2020-03-15 ENCOUNTER — Other Ambulatory Visit: Payer: Self-pay | Admitting: Urology

## 2020-03-29 ENCOUNTER — Other Ambulatory Visit: Payer: Self-pay

## 2020-03-29 ENCOUNTER — Ambulatory Visit: Payer: Managed Care, Other (non HMO) | Admitting: Urology

## 2020-03-29 ENCOUNTER — Encounter: Payer: Self-pay | Admitting: Urology

## 2020-03-29 VITALS — BP 136/84 | HR 56 | Ht 70.0 in | Wt 207.8 lb

## 2020-03-29 DIAGNOSIS — R3129 Other microscopic hematuria: Secondary | ICD-10-CM

## 2020-03-29 DIAGNOSIS — R972 Elevated prostate specific antigen [PSA]: Secondary | ICD-10-CM

## 2020-03-29 MED ORDER — LIDOCAINE HCL URETHRAL/MUCOSAL 2 % EX GEL
1.0000 | Freq: Once | CUTANEOUS | Status: AC
Start: 2020-03-29 — End: 2020-03-29
  Administered 2020-03-29: 1 via URETHRAL

## 2020-03-29 NOTE — Progress Notes (Signed)
   03/29/20  CC:  Chief Complaint  Patient presents with  . Cysto    HPI: F/u - for cysto and PSA elevation. Incidental 8 mm ground-glass nodule in posterior right lower lobe noted on 08/21 CT. Initial follow-up with CT at 6-12 months is recommended to confirm persistence. If persistent, repeat CT is recommended every 2 years until 5 years of stability has been established per radiologist.   1) PSA elevation.  His PSA has been a little bit high for his age over the past few years and at last check was 4.3. PSA history: 01/17 PSA 3.1 03/18 PSA 3.7 03/19 PSA 3.9 06/20 PSA 4 07/21 PSA 4.3 - nl dre 08/21 Prostate 75 g on CT   PSAV 0.3 / year. No FH of PCa. Dad had kidney cancer.   Prostate about 75 g on CT 08/21.   2) MH - UA 07/21 with 3-10 rbc. 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 Has left testicle pain on left. 2-3 yrs. He has some hesitancy and weak stream. MH on UA today - 3-10 rbc. Otw clear. CT done 02/16/2020 benign     There were no vitals taken for this visit. NED. A&Ox3.   No respiratory distress   Abd soft, NT, ND Normal phallus with bilateral descended testicles  Cystoscopy Procedure Note  Patient identification was confirmed, informed consent was obtained, and patient was prepped using Betadine solution.  Lidocaine jelly was administered per urethral meatus.     Pre-Procedure: - Inspection reveals a normal caliber ureteral meatus.  Procedure: The flexible cystoscope was introduced without difficulty - No urethral strictures/lesions are present. - moderate lateral lobe hyperplasia and BOO - slightly elevated bladder neck - Bilateral ureteral orifices identified - Bladder mucosa  reveals no ulcers, tumors, or lesions - No bladder stones - No trabeculation  Retroflexion shows normal bladder/bladder neck    Post-Procedure: - Patient tolerated the procedure well  Imaging reviewed - CT images 08/21   Assessment/ Plan:  PSA elevation - we discussed his PSAV and PSAD are  normal. Will recheck PSA in 4 mo and consider bx. Also disc lung nodule and we'll set up a CT chest when he returns.   MH - benign eval     Festus Aloe, MD

## 2020-03-29 NOTE — Patient Instructions (Signed)
Prostate Cancer Screening  Prostate cancer screening is a test that is done to check for the presence of prostate cancer in men. The prostate gland is a walnut-sized gland that is located below the bladder and in front of the rectum in males. The function of the prostate is to add fluid to semen during ejaculation. Prostate cancer is the second most common type of cancer in men. Who should have prostate cancer screening?  Screening recommendations vary based on age and other risk factors. Screening is recommended if:  You are older than age 55. If you are age 55-69, talk with your health care provider about your need for screening and how often screening should be done. Because most prostate cancers are slow growing and will not cause death, screening is generally reserved in this age group for men who have a 10-15-year life expectancy.  You are younger than age 55, and you have these risk factors: ? Being a black male or a male of African descent. ? Having a father, brother, or uncle who has been diagnosed with prostate cancer. The risk is higher if your family member's cancer occurred at an early age. Screening is not recommended if:  You are younger than age 40.  You are between the ages of 40 and 54 and you have no risk factors.  You are 70 years of age or older. At this age, the risks that screening can cause are greater than the benefits that it may provide. If you are at high risk for prostate cancer, your health care provider may recommend that you have screenings more often or that you start screening at a younger age. How is screening for prostate cancer done? The recommended prostate cancer screening test is a blood test called the prostate-specific antigen (PSA) test. PSA is a protein that is made in the prostate. As you age, your prostate naturally produces more PSA. Abnormally high PSA levels may be caused by:  Prostate cancer.  An enlarged prostate that is not caused by cancer  (benign prostatic hyperplasia, BPH). This condition is very common in older men.  A prostate gland infection (prostatitis). Depending on the PSA results, you may need more tests, such as:  A physical exam to check the size of your prostate gland.  Blood and imaging tests.  A procedure to remove tissue samples from your prostate gland for testing (biopsy). What are the benefits of prostate cancer screening?  Screening can help to identify cancer at an early stage, before symptoms start and when the cancer can be treated more easily.  There is a small chance that screening may lower your risk of dying from prostate cancer. The chance is small because prostate cancer is a slow-growing cancer, and most men with prostate cancer die from a different cause. What are the risks of prostate cancer screening? The main risk of prostate cancer screening is diagnosing and treating prostate cancer that would never have caused any symptoms or problems. This is called overdiagnosisand overtreatment. PSA screening cannot tell you if your PSA is high due to cancer or a different cause. A prostate biopsy is the only procedure to diagnose prostate cancer. Even the results of a biopsy may not tell you if your cancer needs to be treated. Slow-growing prostate cancer may not need any treatment other than monitoring, so diagnosing and treating it may cause unnecessary stress or other side effects. A prostate biopsy may also cause:  Infection or fever.  A false negative. This is   a result that shows that you do not have prostate cancer when you actually do have prostate cancer. Questions to ask your health care provider  When should I start prostate cancer screening?  What is my risk for prostate cancer?  How often do I need screening?  What type of screening tests do I need?  How do I get my test results?  What do my results mean?  Do I need treatment? Where to find more information  The American Cancer  Society: www.cancer.org  American Urological Association: www.auanet.org Contact a health care provider if:  You have difficulty urinating.  You have pain when you urinate or ejaculate.  You have blood in your urine or semen.  You have pain in your back or in the area of your prostate. Summary  Prostate cancer is a common type of cancer in men. The prostate gland is located below the bladder and in front of the rectum. This gland adds fluid to semen during ejaculation.  Prostate cancer screening may identify cancer at an early stage, when the cancer can be treated more easily.  The prostate-specific antigen (PSA) test is the recommended screening test for prostate cancer.  Discuss the risks and benefits of prostate cancer screening with your health care provider. If you are age 70 or older, the risks that screening can cause are greater than the benefits that it may provide. This information is not intended to replace advice given to you by your health care provider. Make sure you discuss any questions you have with your health care provider. Document Revised: 02/09/2019 Document Reviewed: 02/09/2019 Elsevier Patient Education  2020 Elsevier Inc.  

## 2020-04-01 LAB — URINALYSIS, COMPLETE
Bilirubin, UA: NEGATIVE
Glucose, UA: NEGATIVE
Ketones, UA: NEGATIVE
Leukocytes,UA: NEGATIVE
Nitrite, UA: NEGATIVE
Protein,UA: NEGATIVE
RBC, UA: NEGATIVE
Specific Gravity, UA: 1.025 (ref 1.005–1.030)
Urobilinogen, Ur: 0.2 mg/dL (ref 0.2–1.0)
pH, UA: 7 (ref 5.0–7.5)

## 2020-04-01 LAB — MICROSCOPIC EXAMINATION: Bacteria, UA: NONE SEEN

## 2020-04-30 ENCOUNTER — Ambulatory Visit: Payer: Managed Care, Other (non HMO) | Admitting: Family Medicine

## 2020-04-30 ENCOUNTER — Encounter: Payer: Self-pay | Admitting: Family Medicine

## 2020-04-30 VITALS — BP 126/69 | HR 54 | Ht 70.0 in | Wt 210.0 lb

## 2020-04-30 DIAGNOSIS — G4733 Obstructive sleep apnea (adult) (pediatric): Secondary | ICD-10-CM

## 2020-04-30 DIAGNOSIS — Z9989 Dependence on other enabling machines and devices: Secondary | ICD-10-CM

## 2020-04-30 NOTE — Progress Notes (Signed)
PATIENT: Christopher Parsons DOB: 1961/05/27  REASON FOR VISIT: follow up HISTORY FROM: patient  Chief Complaint  Patient presents with  . Follow-up    [facility]  . Sleep Apnea    pt has no new concerns     HISTORY OF PRESENT ILLNESS: Today 04/30/20 Christopher Parsons is a 59 y.o. male here today for follow up for OSA on CPAP. He is doing very well on therapy. He is using a full face mask and feels it is the best fit for him. He monitors for leak at home but has not been able to identify specific contributors. He does have facial hair that could contribute to leak. He is feeling great and notes benefit of CPAP therapy.   Compliance report dated 03/30/2020 to 04/28/2020 reveals he has used CPAP 30 of the past 30 days for compliance of 100%.  He is CPAP greater than 4 hours 28 of the past 30 days for compliance of 93%.  Average usage was 6 hours and 44 minutes.  Residual AHI was 6.1 on 10 to 16 cm of water pressure and an EPR of 2.  There was a significant leak noted in the 95th percentile of 33.7.   HISTORY: (copied from Dr Dohmeier's note on 05/01/2019)  UPDATE 05-01-2019, [name], a meanwhile 59 year old male returns for CPAP Compliance.  He reports having a good experience most nights , having good air seals. Many nights there is a problem with the air seal, and he has facial hair and is a mouth breathier as well. BMI 30 kg/m2. He lost 12 pounds.  ( He has been 100% compliant CPAP user and 90% by time for the 30 days preceding 26 April 2019.  Average use at home is 6 hours and 40 minutes the pressure is set at 12 cmH2O of a 3 cm EPR also his machine allows auto titration.  The residual AHI has risen to 9.6/h and the vast majority of these events is obstructive in nature.  I do think this is possibly at least due to higher air leaks just over the last 14 days in comparison to the previous 15 days I would much like to increase the pressure slightly in order to control the  obstructive apneas, hopefully not increasing central apneas in the process.    REVIEW OF SYSTEMS: Out of a complete 14 system review of symptoms, the patient complains only of the following symptoms, none and all other reviewed systems are negative.  ESS:8 FSS:31  ALLERGIES: No Known Allergies  HOME MEDICATIONS: Outpatient Medications Prior to Visit  Medication Sig Dispense Refill  . aspirin 81 MG tablet Take 81 mg by mouth daily.    Marland Kitchen omeprazole (PRILOSEC) 40 MG capsule TAKE 1 CAPSULE BY MOUTH  DAILY 90 capsule 3  . mometasone (NASONEX) 50 MCG/ACT nasal spray Place 2 sprays into the nose daily. 17 g 12   No facility-administered medications prior to visit.    PAST MEDICAL HISTORY: Past Medical History:  Diagnosis Date  . GERD (gastroesophageal reflux disease)   . Hypothyroidism   . Mitral valve prolapse    echo 12/14 on paper chart  . OSA on CPAP     PAST SURGICAL HISTORY: Past Surgical History:  Procedure Laterality Date  . COLONOSCOPY    . ESOPHAGEAL DILATION N/A 01/27/2018   Procedure: ESOPHAGEAL DILATION;  Surgeon: [name], MD;  Location: [place];  Service: Endoscopy;  Laterality: N/A;  . ESOPHAGOGASTRODUODENOSCOPY    .  ESOPHAGOGASTRODUODENOSCOPY (EGD) WITH PROPOFOL N/A 01/18/2015   Procedure: ESOPHAGOGASTRODUODENOSCOPY (EGD) WITH PROPOFOL with dialtion;  Surgeon: [name], MD;  Location: [place];  Service: Endoscopy;  Laterality: N/A;  . ESOPHAGOGASTRODUODENOSCOPY (EGD) WITH PROPOFOL N/A 05/17/2015   Procedure: ESOPHAGOGASTRODUODENOSCOPY (EGD) WITH PROPOFOL, WITH DIALATION;  Surgeon: [name], MD;  Location: [place];  Service: Endoscopy;  Laterality: N/A;  . ESOPHAGOGASTRODUODENOSCOPY (EGD) WITH PROPOFOL N/A 12/10/2017   Procedure: ESOPHAGOGASTRODUODENOSCOPY (EGD) WITH PROPOFOL;  Surgeon: [name], MD;  Location: [place];  Service: Endoscopy;  Laterality: N/A;  . ESOPHAGOGASTRODUODENOSCOPY (EGD) WITH  PROPOFOL N/A 01/27/2018   Procedure: ESOPHAGOGASTRODUODENOSCOPY (EGD) WITH PROPOFOL;  Surgeon: [name], MD;  Location: [place];  Service: Endoscopy;  Laterality: N/A;  . GANGLION CYST EXCISION Left    wrist  . TONSILLECTOMY    . VASECTOMY      FAMILY HISTORY: Family History  Problem Relation Age of Onset  . Hyperlipidemia Mother   . Cancer Father 68       lung cancer  . Healthy Sister   . Healthy Brother   . Healthy Brother     SOCIAL HISTORY: Social History   Socioeconomic History  . Marital status: Married    Spouse name: Not on file  . Number of children: 3  . Years of education: 79  . Highest education level: Not on file  Occupational History  . Not on file  Tobacco Use  . Smoking status: Never Smoker  . Smokeless tobacco: Never Used  Vaping Use  . Vaping Use: Never used  Substance and Sexual Activity  . Alcohol use: Yes    Alcohol/week: 5.0 standard drinks    Types: 5 Shots of liquor per week    Comment: 1-2 drinks daily  . Drug use: No  . Sexual activity: Yes    Birth control/protection: None    Comment: vasectomy  Other Topics Concern  . Not on file  Social History Narrative  . Not on file   Social Determinants of Health   Financial Resource Strain:   . Difficulty of Paying Living Expenses: Not on file  Food Insecurity:   . Worried About Charity fundraiser in the Last Year: Not on file  . Ran Out of Food in the Last Year: Not on file  Transportation Needs:   . Lack of Transportation (Medical): Not on file  . Lack of Transportation (Non-Medical): Not on file  Physical Activity:   . Days of Exercise per Week: Not on file  . Minutes of Exercise per Session: Not on file  Stress:   . Feeling of Stress : Not on file  Social Connections:   . Frequency of Communication with Friends and Family: Not on file  . Frequency of Social Gatherings with Friends and Family: Not on file  . Attends Religious Services: Not on file  . Active Member  of Clubs or Organizations: Not on file  . Attends Archivist Meetings: Not on file  . Marital Status: Not on file  Intimate Partner Violence:   . Fear of Current or Ex-Partner: Not on file  . Emotionally Abused: Not on file  . Physically Abused: Not on file  . Sexually Abused: Not on file     PHYSICAL EXAM  Vitals:   04/30/20 0822  BP: 126/69  Pulse: (!) 54  Weight: 210 lb (95.3 kg)  Height: 5\' 10"  (1.778 m)   Body mass index is 30.13 kg/m.  Generalized: Well developed, in  no acute distress  Cardiology: normal rate and rhythm, no murmur noted Respiratory: clear to auscultation bilaterally  Neurological examination  Mentation: Alert oriented to time, place, history taking. Follows all commands speech and language fluent Cranial nerve II-XII: Pupils were equal round reactive to light. Extraocular movements were full, visual field were full  Motor: The motor testing reveals 5 over 5 strength of all 4 extremities. Good symmetric motor tone is noted throughout.  Gait and station: Gait is normal.    DIAGNOSTIC DATA (LABS, IMAGING, TESTING) - I reviewed patient records, labs, notes, testing and imaging myself where available.  No flowsheet data found.   Lab Results  Component Value Date   WBC 7.5 01/12/2020   HGB 15.5 01/12/2020   HCT 44.9 01/12/2020   MCV 91 01/12/2020   PLT 319 01/12/2020      Component Value Date/Time   NA 139 01/12/2020 0821   K 4.6 01/12/2020 0821   CL 103 01/12/2020 0821   CO2 22 01/12/2020 0821   GLUCOSE 97 01/12/2020 0821   BUN 16 01/12/2020 0821   CREATININE 1.05 01/12/2020 0821   CALCIUM 9.8 01/12/2020 0821   PROT 7.1 01/12/2020 0821   ALBUMIN 4.5 01/12/2020 0821   AST 19 01/12/2020 0821   ALT 22 01/12/2020 0821   ALKPHOS 44 (L) 01/12/2020 0821   BILITOT 0.4 01/12/2020 0821   GFRNONAA 77 01/12/2020 0821   GFRAA 89 01/12/2020 0821   Lab Results  Component Value Date   CHOL 214 (H) 01/12/2020   HDL 47 01/12/2020    LDLCALC 156 (H) 01/12/2020   TRIG 62 01/12/2020   CHOLHDL 4.6 01/12/2020   No results found for: HGBA1C No results found for: VITAMINB12 Lab Results  Component Value Date   TSH 2.880 01/12/2020     ASSESSMENT AND PLAN 59 y.o. year old male  has a past medical history of GERD (gastroesophageal reflux disease), Hypothyroidism, Mitral valve prolapse, and OSA on CPAP. here with     ICD-10-CM   1. Obstructive sleep apnea treated with continuous positive airway pressure (CPAP)  G47.33 For home use only DME continuous positive airway pressure (CPAP)   Z99.89      Christopher Parsons is doing well on CPAP therapy.  Compliance report reveals excellent compliance.  Residual AHI is now 6.1, down from 9.6.  He has adjusted well to pressure changes from a set pressure of 12 cm of water to auto titration 10 to 16 cm of water.  We have discussed concerns of leak.  He was encouraged to ensure that mask and headgear are well fitting. Mask refitting offered but he declines for now. He was encouraged to continue using CPAP nightly and for greater than 4 hours each night. We will update supply orders as indicated. Risks of untreated sleep apnea review and education materials provided. Healthy lifestyle habits encouraged. He will follow up in 1 year, sooner if needed. He verbalizes understanding and agreement with this plan.    Orders Placed This Encounter  Procedures  . For home use only DME continuous positive airway pressure (CPAP)    Supplies    Order Specific Question:   Length of Need    Answer:   Lifetime    Order Specific Question:   Patient has OSA or probable OSA    Answer:   Yes    Order Specific Question:   Is the patient currently using CPAP in the home    Answer:   Yes  Order Specific Question:   Settings    Answer:   Other see comments    Order Specific Question:   CPAP supplies needed    Answer:   Mask, headgear, cushions, filters, heated tubing and water chamber     No orders of  the defined types were placed in this encounter.     I spent 15 minutes with the patient. 50% of this time was spent counseling and educating patient on plan of care and medications.    Debbora Presto, FNP-C 04/30/2020, 8:58 AM [facility] [address] [place], [place] [address] [phone]

## 2020-04-30 NOTE — Patient Instructions (Signed)
Please continue using your CPAP regularly. While your insurance requires that you use CPAP at least 4 hours each night on 70% of the nights, I recommend, that you not skip any nights and use it throughout the night if you can. Getting used to CPAP and staying with the treatment long term does take time and patience and discipline. Untreated obstructive sleep apnea when it is moderate to severe can have an adverse impact on cardiovascular health and raise her risk for heart disease, arrhythmias, hypertension, congestive heart failure, stroke and diabetes. Untreated obstructive sleep apnea causes sleep disruption, nonrestorative sleep, and sleep deprivation. This can have an impact on your day to day functioning and cause daytime sleepiness and impairment of cognitive function, memory loss, mood disturbance, and problems focussing. Using CPAP regularly can improve these symptoms.   Continue to monitor for correct mask seal at home. Follow up in 1 year   Sleep Apnea Sleep apnea affects breathing during sleep. It causes breathing to stop for a short time or to become shallow. It can also increase the risk of:  Heart attack.  Stroke.  Being very overweight (obese).  Diabetes.  Heart failure.  Irregular heartbeat. The goal of treatment is to help you breathe normally again. What are the causes? There are three kinds of sleep apnea:  Obstructive sleep apnea. This is caused by a blocked or collapsed airway.  Central sleep apnea. This happens when the brain does not send the right signals to the muscles that control breathing.  Mixed sleep apnea. This is a combination of obstructive and central sleep apnea. The most common cause of this condition is a collapsed or blocked airway. This can happen if:  Your throat muscles are too relaxed.  Your tongue and tonsils are too large.  You are overweight.  Your airway is too small. What increases the risk?  Being  overweight.  Smoking.  Having a small airway.  Being older.  Being male.  Drinking alcohol.  Taking medicines to calm yourself (sedatives or tranquilizers).  Having family members with the condition. What are the signs or symptoms?  Trouble staying asleep.  Being sleepy or tired during the day.  Getting angry a lot.  Loud snoring.  Headaches in the morning.  Not being able to focus your mind (concentrate).  Forgetting things.  Less interest in sex.  Mood swings.  Personality changes.  Feelings of sadness (depression).  Waking up a lot during the night to pee (urinate).  Dry mouth.  Sore throat. How is this diagnosed?  Your medical history.  A physical exam.  A test that is done when you are sleeping (sleep study). The test is most often done in a sleep lab but may also be done at home. How is this treated?   Sleeping on your side.  Using a medicine to get rid of mucus in your nose (decongestant).  Avoiding the use of alcohol, medicines to help you relax, or certain pain medicines (narcotics).  Losing weight, if needed.  Changing your diet.  Not smoking.  Using a machine to open your airway while you sleep, such as: ? An oral appliance. This is a mouthpiece that shifts your lower jaw forward. ? A CPAP device. This device blows air through a mask when you breathe out (exhale). ? An EPAP device. This has valves that you put in each nostril. ? A BPAP device. This device blows air through a mask when you breathe in (inhale) and breathe out.  Having  surgery if other treatments do not work. It is important to get treatment for sleep apnea. Without treatment, it can lead to:  High blood pressure.  Coronary artery disease.  In men, not being able to have an erection (impotence).  Reduced thinking ability. Follow these instructions at home: Lifestyle  Make changes that your doctor recommends.  Eat a healthy diet.  Lose weight if  needed.  Avoid alcohol, medicines to help you relax, and some pain medicines.  Do not use any products that contain nicotine or tobacco, such as cigarettes, e-cigarettes, and chewing tobacco. If you need help quitting, ask your doctor. General instructions  Take over-the-counter and prescription medicines only as told by your doctor.  If you were given a machine to use while you sleep, use it only as told by your doctor.  If you are having surgery, make sure to tell your doctor you have sleep apnea. You may need to bring your device with you.  Keep all follow-up visits as told by your doctor. This is important. Contact a doctor if:  The machine that you were given to use during sleep bothers you or does not seem to be working.  You do not get better.  You get worse. Get help right away if:  Your chest hurts.  You have trouble breathing in enough air.  You have an uncomfortable feeling in your back, arms, or stomach.  You have trouble talking.  One side of your body feels weak.  A part of your face is hanging down. These symptoms may be an emergency. Do not wait to see if the symptoms will go away. Get medical help right away. Call your local emergency services (911 in the [place] Do not drive yourself to the hospital. Summary  This condition affects breathing during sleep.  The most common cause is a collapsed or blocked airway.  The goal of treatment is to help you breathe normally while you sleep. This information is not intended to replace advice given to you by your health care provider. Make sure you discuss any questions you have with your health care provider. Document Revised: 04/15/2018 Document Reviewed: 02/22/2018 Elsevier Patient Education  [place].

## 2020-04-30 NOTE — Progress Notes (Signed)
Order for cpap supplies sent to [facility] via community message. Confirmation received that the order transmitted was successful.  

## 2020-07-26 ENCOUNTER — Other Ambulatory Visit: Payer: Managed Care, Other (non HMO)

## 2020-07-26 ENCOUNTER — Other Ambulatory Visit: Payer: Self-pay

## 2020-07-26 DIAGNOSIS — R972 Elevated prostate specific antigen [PSA]: Secondary | ICD-10-CM

## 2020-07-27 LAB — PSA: Prostate Specific Ag, Serum: 4.3 ng/mL — ABNORMAL HIGH (ref 0.0–4.0)

## 2020-07-29 ENCOUNTER — Other Ambulatory Visit: Payer: Self-pay

## 2020-08-02 ENCOUNTER — Other Ambulatory Visit: Payer: Self-pay

## 2020-08-02 ENCOUNTER — Encounter: Payer: Self-pay | Admitting: Physician Assistant

## 2020-08-02 ENCOUNTER — Ambulatory Visit: Payer: Managed Care, Other (non HMO) | Admitting: Physician Assistant

## 2020-08-02 VITALS — BP 142/84 | HR 66 | Ht 70.0 in | Wt 212.0 lb

## 2020-08-02 DIAGNOSIS — R911 Solitary pulmonary nodule: Secondary | ICD-10-CM

## 2020-08-02 DIAGNOSIS — R3916 Straining to void: Secondary | ICD-10-CM

## 2020-08-02 DIAGNOSIS — R972 Elevated prostate specific antigen [PSA]: Secondary | ICD-10-CM | POA: Diagnosis not present

## 2020-08-02 DIAGNOSIS — N401 Enlarged prostate with lower urinary tract symptoms: Secondary | ICD-10-CM

## 2020-08-02 MED ORDER — TAMSULOSIN HCL 0.4 MG PO CAPS: 0.4000 mg | ORAL_CAPSULE | Freq: Every day | ORAL | 0 refills | Status: DC

## 2020-08-02 NOTE — Progress Notes (Signed)
08/02/2020 9:37 AM   Christopher Parsons September 16, 1960 [id number]  CC: Chief Complaint  Patient presents with   Follow-up   Elevated PSA   HPI: Christopher Parsons is a 60 y.o. male with PMH BPH with BOO, elevated PSA, and microscopic hematuria with benign work-up in 2021 who presents today for repeat PSA.  PSA history as follows: 01/17 PSA 3.1 03/18 PSA 3.7 03/19 PSA 3.9 06/20 PSA 4 07/21 PSA 4.3 - nl dre 08/21 Prostate 75 g on CT  01/22 PSA 4.3  Notably, there was an incidental finding on his CT AP from August 2021 of an 8 mm groundglass nodule in the posterior right lower lobe. Follow-up imaging in 6-12 months was recommended at that time.  Today he reports stable urinary symptoms since his last clinic visit.  He continues to experience straining with urination and chronic left inguinal pain exacerbated with increased frequency of sex without painful ejaculation.  He has been treated with antibiotics for this in the past with some success, but overall his symptoms have been persistent.  PMH: Past Medical History:  Diagnosis Date   GERD (gastroesophageal reflux disease)    Hypothyroidism    Mitral valve prolapse    echo 12/14 on paper chart   OSA on CPAP     Surgical History: Past Surgical History:  Procedure Laterality Date   COLONOSCOPY     ESOPHAGEAL DILATION N/A 01/27/2018   Procedure: ESOPHAGEAL DILATION;  Surgeon: [name], MD;  Location: [place];  Service: Endoscopy;  Laterality: N/A;   ESOPHAGOGASTRODUODENOSCOPY     ESOPHAGOGASTRODUODENOSCOPY (EGD) WITH PROPOFOL N/A 01/18/2015   Procedure: ESOPHAGOGASTRODUODENOSCOPY (EGD) WITH PROPOFOL with dialtion;  Surgeon: [name], MD;  Location: [place];  Service: Endoscopy;  Laterality: N/A;   ESOPHAGOGASTRODUODENOSCOPY (EGD) WITH PROPOFOL N/A 05/17/2015   Procedure: ESOPHAGOGASTRODUODENOSCOPY (EGD) WITH PROPOFOL, WITH DIALATION;  Surgeon: [name], MD;  Location: [place];  Service: Endoscopy;  Laterality: N/A;   ESOPHAGOGASTRODUODENOSCOPY (EGD) WITH PROPOFOL N/A 12/10/2017   Procedure: ESOPHAGOGASTRODUODENOSCOPY (EGD) WITH PROPOFOL;  Surgeon: [name], MD;  Location: [place];  Service: Endoscopy;  Laterality: N/A;   ESOPHAGOGASTRODUODENOSCOPY (EGD) WITH PROPOFOL N/A 01/27/2018   Procedure: ESOPHAGOGASTRODUODENOSCOPY (EGD) WITH PROPOFOL;  Surgeon: [name], MD;  Location: [place];  Service: Endoscopy;  Laterality: N/A;   GANGLION CYST EXCISION Left    wrist   TONSILLECTOMY     VASECTOMY      Home Medications:  Allergies as of 08/02/2020   No Known Allergies     Medication List       Accurate as of August 02, 2020  9:37 AM. If you have any questions, ask your nurse or doctor.        aspirin 81 MG tablet Take 81 mg by mouth daily.   omeprazole 40 MG capsule Commonly known as: PRILOSEC TAKE 1 CAPSULE BY MOUTH  DAILY   tamsulosin 0.4 MG Caps capsule Commonly known as: FLOMAX Take 1 capsule (0.4 mg total) by mouth daily. Started by: Debroah Loop, PA-C       Allergies:  No Known Allergies  Family History: Family History  Problem Relation Age of Onset   Hyperlipidemia Mother    Cancer Father 17       lung cancer   Kidney cancer Father        Underwent nephrectomy   Throat cancer Father    Healthy Sister    Healthy Brother    Healthy Brother  Social History:   reports that he has never smoked. He has never used smokeless tobacco. He reports current alcohol use of about 5.0 standard drinks of alcohol per week. He reports that he does not use drugs.  Physical Exam: BP (!) 142/84    Pulse 66    Ht 5\' 10"  (1.778 m)    Wt 212 lb (96.2 kg)    BMI 30.42 kg/m   Constitutional:  Alert and oriented, no acute distress, nontoxic appearing HEENT: [place], AT Cardiovascular: No clubbing, cyanosis, or edema Respiratory: Normal respiratory effort, no increased work of breathing Skin: No  rashes, bruises or suspicious lesions Neurologic: Grossly intact, no focal deficits, moving all 4 extremities Psychiatric: Normal mood and affect  Laboratory Data: Results for orders placed or performed in visit on 07/26/20  PSA  Result Value Ref Range   Prostate Specific Ag, Serum 4.3 (H) 0.0 - 4.0 ng/mL   Assessment & Plan:   1. Elevated PSA PSA stable today, will continue annual PSA monitoring and DRE and defer biopsy at this time. Patient is in agreement with this plan  2. Incidental pulmonary nodule, greater than or equal to 25mm Will be due for 6 mos follow-up imaging in February; order placed today. - CT CHEST WO CONTRAST; Future  3. Benign prostatic hyperplasia (BPH) with straining on urination Will start a trial of Flomax today with plans for sx recheck in 1 month. May consider pelvic PT in the future for possible pelvic floor dysfunction due to reports of chronic left groin pain, though I suspect he will have at least some symptomatic improvement due to findings of obstructive prostate on recent cystoscopy. - tamsulosin (FLOMAX) 0.4 MG CAPS capsule; Take 1 capsule (0.4 mg total) by mouth daily.  Dispense: 30 capsule; Refill: 0   Return in about 4 weeks (around 08/30/2020) for Symptom recheck with PVR and IPSS.  Debroah Loop, PA-C  [facility] [address] [place], [place] [address] [phone]

## 2020-08-24 ENCOUNTER — Other Ambulatory Visit: Payer: Self-pay | Admitting: Physician Assistant

## 2020-08-24 DIAGNOSIS — R3916 Straining to void: Secondary | ICD-10-CM

## 2020-08-24 DIAGNOSIS — N401 Enlarged prostate with lower urinary tract symptoms: Secondary | ICD-10-CM

## 2020-08-29 ENCOUNTER — Other Ambulatory Visit: Payer: Self-pay

## 2020-08-29 ENCOUNTER — Ambulatory Visit
Admission: RE | Admit: 2020-08-29 | Discharge: 2020-08-29 | Disposition: A | Discharge status: Home / Self Care | Payer: Managed Care, Other (non HMO) | Source: Ambulatory Visit | Attending: Physician Assistant | Admitting: Physician Assistant

## 2020-08-29 DIAGNOSIS — R911 Solitary pulmonary nodule: Secondary | ICD-10-CM | POA: Diagnosis present

## 2020-09-02 ENCOUNTER — Encounter: Payer: Self-pay | Admitting: Physician Assistant

## 2020-09-02 ENCOUNTER — Ambulatory Visit: Payer: Managed Care, Other (non HMO) | Admitting: Physician Assistant

## 2020-09-02 ENCOUNTER — Other Ambulatory Visit: Payer: Self-pay

## 2020-09-02 ENCOUNTER — Other Ambulatory Visit: Payer: Self-pay | Admitting: Physician Assistant

## 2020-09-02 VITALS — BP 135/74 | HR 64 | Ht 70.0 in | Wt 211.8 lb

## 2020-09-02 DIAGNOSIS — R911 Solitary pulmonary nodule: Secondary | ICD-10-CM | POA: Diagnosis not present

## 2020-09-02 DIAGNOSIS — N401 Enlarged prostate with lower urinary tract symptoms: Secondary | ICD-10-CM

## 2020-09-02 DIAGNOSIS — R3916 Straining to void: Secondary | ICD-10-CM | POA: Diagnosis not present

## 2020-09-02 LAB — BLADDER SCAN AMB NON-IMAGING

## 2020-09-02 MED ORDER — TAMSULOSIN HCL 0.4 MG PO CAPS: 0.4000 mg | ORAL_CAPSULE | Freq: Every day | ORAL | 3 refills | Status: DC

## 2020-09-02 NOTE — Progress Notes (Signed)
09/02/2020 9:41 AM   Christopher Parsons Mar 21, 1961 [id number]  CC: Chief Complaint  Patient presents with  . Follow-up    HPI: Christopher Parsons is a 60 y.o. male with PMH BPH with BOO, elevated PSA, and microscopic hematuria with benign work-up in 2021 who presents today for follow-up on Flomax.  Since his last clinic visit, he underwent CT chest for follow-up imaging of an incidental 8 mm posterior right lower lobe groundglass nodule.  Today he reports improvement in his urinary symptoms on Flomax.  He denies orthostasis.  He states overall he is pleased with his progress and wishes to continue this medication.  He does not wish to pursue additional pharmacotherapy or outlet procedures at this time.  He denies erectile dysfunction.  IPSS 12/mixed today as below.  PVR 48 mL.  CT chest dated 08/30/2020 revealed a stable right lower lobe groundglass nodule with mean diameter of 6 mm.  Per report, repeat CT is recommended every 2 years until 5 years of stability has been established.   IPSS    Row Name 09/02/20 0900         International Prostate Symptom Score   How often have you had the sensation of not emptying your bladder? About half the time     How often have you had to urinate less than every two hours? About half the time     How often have you found you stopped and started again several times when you urinated? Less than half the time     How often have you found it difficult to postpone urination? Less than 1 in 5 times     How often have you had a weak urinary stream? Less than half the time     How often have you had to strain to start urination? Less than 1 in 5 times     How many times did you typically get up at night to urinate? None     Total IPSS Score 12           Quality of Life due to urinary symptoms   If you were to spend the rest of your life with your urinary condition just the way it is now how would you feel about that? Mixed             PMH: Past Medical History:  Diagnosis Date  . GERD (gastroesophageal reflux disease)   . Hypothyroidism   . Mitral valve prolapse    echo 12/14 on paper chart  . OSA on CPAP     Surgical History: Past Surgical History:  Procedure Laterality Date  . COLONOSCOPY    . ESOPHAGEAL DILATION N/A 01/27/2018   Procedure: ESOPHAGEAL DILATION;  Surgeon: [name], MD;  Location: [place];  Service: Endoscopy;  Laterality: N/A;  . ESOPHAGOGASTRODUODENOSCOPY    . ESOPHAGOGASTRODUODENOSCOPY (EGD) WITH PROPOFOL N/A 01/18/2015   Procedure: ESOPHAGOGASTRODUODENOSCOPY (EGD) WITH PROPOFOL with dialtion;  Surgeon: [name], MD;  Location: [place];  Service: Endoscopy;  Laterality: N/A;  . ESOPHAGOGASTRODUODENOSCOPY (EGD) WITH PROPOFOL N/A 05/17/2015   Procedure: ESOPHAGOGASTRODUODENOSCOPY (EGD) WITH PROPOFOL, WITH DIALATION;  Surgeon: [name], MD;  Location: [place];  Service: Endoscopy;  Laterality: N/A;  . ESOPHAGOGASTRODUODENOSCOPY (EGD) WITH PROPOFOL N/A 12/10/2017   Procedure: ESOPHAGOGASTRODUODENOSCOPY (EGD) WITH PROPOFOL;  Surgeon: [name], MD;  Location: [place];  Service: Endoscopy;  Laterality: N/A;  . ESOPHAGOGASTRODUODENOSCOPY (EGD) WITH PROPOFOL N/A 01/27/2018   Procedure: ESOPHAGOGASTRODUODENOSCOPY (EGD) WITH  PROPOFOL;  Surgeon: [name], MD;  Location: [place];  Service: Endoscopy;  Laterality: N/A;  . GANGLION CYST EXCISION Left    wrist  . TONSILLECTOMY    . VASECTOMY      Home Medications:  Allergies as of 09/02/2020   No Known Allergies     Medication List       Accurate as of September 02, 2020  9:41 AM. If you have any questions, ask your nurse or doctor.        aspirin 81 MG tablet Take 81 mg by mouth daily.   omeprazole 40 MG capsule Commonly known as: PRILOSEC TAKE 1 CAPSULE BY MOUTH  DAILY   tamsulosin 0.4 MG Caps capsule Commonly known as: FLOMAX Take 1 capsule (0.4 mg total) by mouth  daily.      Allergies:  No Known Allergies  Family History: Family History  Problem Relation Age of Onset  . Hyperlipidemia Mother   . Cancer Father 41       lung cancer  . Kidney cancer Father        Underwent nephrectomy  . Throat cancer Father   . Healthy Sister   . Healthy Brother   . Healthy Brother     Social History:   reports that he has never smoked. He has never used smokeless tobacco. He reports current alcohol use of about 5.0 standard drinks of alcohol per week. He reports that he does not use drugs.  Physical Exam: BP 135/74 (BP Location: Left Arm, Patient Position: Sitting, Cuff Size: Large)   Pulse 64   Ht 5\' 10"  (1.778 m)   Wt 211 lb 12.8 oz (96.1 kg)   BMI 30.39 kg/m   Constitutional:  Alert and oriented, no acute distress, nontoxic appearing HEENT: [place], AT Cardiovascular: No clubbing, cyanosis, or edema Respiratory: Normal respiratory effort, no increased work of breathing Skin: No rashes, bruises or suspicious lesions Neurologic: Grossly intact, no focal deficits, moving all 4 extremities Psychiatric: Normal mood and affect  Laboratory Data: Results for orders placed or performed in visit on 09/02/20  Bladder Scan (Post Void Residual) in office  Result Value Ref Range   Scan Result 12mL    Assessment & Plan:   1. Benign prostatic hyperplasia (BPH) with straining on urination Symptoms improved on Flomax 0.4 mg daily and PVR WNL. Will refill for an additional year and plan for annual follow-up in clinic, sooner with symptomatic worsening.  We discussed alternative pharmacotherapy and surgical options for enlarged prostate.  Will defer these at this time. - Bladder Scan (Post Void Residual) in office - PSA; Future - tamsulosin (FLOMAX) 0.4 MG CAPS capsule; Take 1 capsule (0.4 mg total) by mouth daily.  Dispense: 90 capsule; Refill: 3  2. Incidental pulmonary nodule, greater than or equal to 42mm Stable on follow-up CT.  Will defer to PCP for  further monitoring.  Return in about 1 year (around 09/02/2021) for 66yr Follow up w/ PSA prior, PVR, IPSS .  Christopher Loop, PA-C  [facility] [address] [place], [place] [address] [phone]

## 2020-09-02 NOTE — Patient Instructions (Signed)

## 2020-09-20 ENCOUNTER — Other Ambulatory Visit: Payer: Self-pay | Admitting: Physician Assistant

## 2020-09-20 DIAGNOSIS — N401 Enlarged prostate with lower urinary tract symptoms: Secondary | ICD-10-CM

## 2020-11-29 ENCOUNTER — Other Ambulatory Visit: Payer: Self-pay | Admitting: Physician Assistant

## 2020-11-29 MED ORDER — SILODOSIN 8 MG PO CAPS: 8.0000 mg | ORAL_CAPSULE | Freq: Every day | ORAL | 0 refills | Status: DC

## 2021-01-02 ENCOUNTER — Other Ambulatory Visit: Payer: Self-pay | Admitting: Physician Assistant

## 2021-01-02 MED ORDER — SILODOSIN 8 MG PO CAPS: 8.0000 mg | ORAL_CAPSULE | Freq: Every day | ORAL | 11 refills | Status: DC

## 2021-01-14 ENCOUNTER — Ambulatory Visit (INDEPENDENT_AMBULATORY_CARE_PROVIDER_SITE_OTHER): Payer: Managed Care, Other (non HMO) | Admitting: Family Medicine

## 2021-01-14 ENCOUNTER — Other Ambulatory Visit: Payer: Self-pay

## 2021-01-14 ENCOUNTER — Encounter: Payer: Self-pay | Admitting: Family Medicine

## 2021-01-14 VITALS — BP 127/73 | HR 55 | Temp 98.6°F | Resp 16 | Ht 70.0 in | Wt 213.0 lb

## 2021-01-14 DIAGNOSIS — Z125 Encounter for screening for malignant neoplasm of prostate: Secondary | ICD-10-CM

## 2021-01-14 DIAGNOSIS — Z1211 Encounter for screening for malignant neoplasm of colon: Secondary | ICD-10-CM | POA: Diagnosis not present

## 2021-01-14 DIAGNOSIS — Z0000 Encounter for general adult medical examination without abnormal findings: Secondary | ICD-10-CM | POA: Diagnosis not present

## 2021-01-14 DIAGNOSIS — G4733 Obstructive sleep apnea (adult) (pediatric): Secondary | ICD-10-CM

## 2021-01-14 DIAGNOSIS — R911 Solitary pulmonary nodule: Secondary | ICD-10-CM

## 2021-01-14 DIAGNOSIS — Z1389 Encounter for screening for other disorder: Secondary | ICD-10-CM | POA: Diagnosis not present

## 2021-01-14 DIAGNOSIS — Z9989 Dependence on other enabling machines and devices: Secondary | ICD-10-CM

## 2021-01-14 LAB — POCT URINALYSIS DIPSTICK
Bilirubin, UA: NEGATIVE
Blood, UA: NEGATIVE
Glucose, UA: NEGATIVE
Ketones, UA: NEGATIVE
Leukocytes, UA: NEGATIVE
Nitrite, UA: NEGATIVE
Protein, UA: NEGATIVE
Spec Grav, UA: 1.02 (ref 1.010–1.025)
Urobilinogen, UA: 0.2 E.U./dL
pH, UA: 7 (ref 5.0–8.0)

## 2021-01-14 NOTE — Progress Notes (Signed)
Complete physical exam   Patient: Christopher Parsons   DOB: 1961-05-02   60 y.o. Male  MRN: [id number] Visit Date: 01/14/2021  Today's healthcare provider: Wilhemena Durie, MD   Chief Complaint  Patient presents with   Annual Exam   Subjective    Christopher Parsons is a 60 y.o. male who presents today for a complete physical exam.  He reports consuming a general diet. The patient does not participate in regular exercise at present. He generally feels well. He reports sleeping well. He does not have additional problems to discuss today.  Patient is married and the father of 3 children.  He walks some for exercise. Is wearing CPAP nightly and feels well. He did have a pulmonary nodule on CT scan which is stable and was especially repeated in 2024 and again in 2026.  February would be the month to order he is worried about this as his father had renal cell carcinoma that apparently was metastatic to the lung and then developed throat cancer.  Past Medical History:  Diagnosis Date   GERD (gastroesophageal reflux disease)    Hypothyroidism    Mitral valve prolapse    echo 12/14 on paper chart   OSA on CPAP    Past Surgical History:  Procedure Laterality Date   COLONOSCOPY     ESOPHAGEAL DILATION N/A 01/27/2018   Procedure: ESOPHAGEAL DILATION;  Surgeon: [name], MD;  Location: [place];  Service: Endoscopy;  Laterality: N/A;   ESOPHAGOGASTRODUODENOSCOPY     ESOPHAGOGASTRODUODENOSCOPY (EGD) WITH PROPOFOL N/A 01/18/2015   Procedure: ESOPHAGOGASTRODUODENOSCOPY (EGD) WITH PROPOFOL with dialtion;  Surgeon: [name], MD;  Location: [place];  Service: Endoscopy;  Laterality: N/A;   ESOPHAGOGASTRODUODENOSCOPY (EGD) WITH PROPOFOL N/A 05/17/2015   Procedure: ESOPHAGOGASTRODUODENOSCOPY (EGD) WITH PROPOFOL, WITH DIALATION;  Surgeon: [name], MD;  Location: [place];  Service: Endoscopy;  Laterality: N/A;   ESOPHAGOGASTRODUODENOSCOPY (EGD) WITH  PROPOFOL N/A 12/10/2017   Procedure: ESOPHAGOGASTRODUODENOSCOPY (EGD) WITH PROPOFOL;  Surgeon: [name], MD;  Location: [place];  Service: Endoscopy;  Laterality: N/A;   ESOPHAGOGASTRODUODENOSCOPY (EGD) WITH PROPOFOL N/A 01/27/2018   Procedure: ESOPHAGOGASTRODUODENOSCOPY (EGD) WITH PROPOFOL;  Surgeon: [name], MD;  Location: [place];  Service: Endoscopy;  Laterality: N/A;   GANGLION CYST EXCISION Left    wrist   TONSILLECTOMY     VASECTOMY     Social History   Socioeconomic History   Marital status: Married    Spouse name: Not on file   Number of children: 3   Years of education: 12   Highest education level: Not on file  Occupational History   Not on file  Tobacco Use   Smoking status: Never   Smokeless tobacco: Never  Vaping Use   Vaping Use: Never used  Substance and Sexual Activity   Alcohol use: Yes    Alcohol/week: 5.0 standard drinks    Types: 5 Shots of liquor per week    Comment: 1-2 drinks daily   Drug use: No   Sexual activity: Yes    Birth control/protection: Surgical    Comment: vasectomy  Other Topics Concern   Not on file  Social History Narrative   Not on file   Social Determinants of Health   Financial Resource Strain: Not on file  Food Insecurity: Not on file  Transportation Needs: Not on file  Physical Activity: Not on file  Stress: Not on file  Social Connections: Not on file  Intimate Partner Violence: Not on file   Family Status  Relation Name Status   Mother  Alive   Father  Deceased   Sister  Alive   Brother  Alive   Brother  Alive   Family History  Problem Relation Age of Onset   Hyperlipidemia Mother    Cancer Father 85       lung cancer   Kidney cancer Father        Underwent nephrectomy   Throat cancer Father    Healthy Sister    Healthy Brother    Healthy Brother    No Known Allergies  Patient Care Team: Jerrol Banana., MD as PCP - General (Family Medicine)    Medications: Outpatient Medications Prior to Visit  Medication Sig   aspirin 81 MG tablet Take 81 mg by mouth daily.   omeprazole (PRILOSEC) 40 MG capsule TAKE 1 CAPSULE BY MOUTH  DAILY   silodosin (RAPAFLO) 8 MG CAPS capsule Take 1 capsule (8 mg total) by mouth daily with breakfast.   No facility-administered medications prior to visit.    Review of Systems  All other systems reviewed and are negative.     Objective    BP 127/73   Pulse (!) 55   Temp 98.6 F (37 C)   Resp 16   Ht 5\' 10"  (1.778 m)   Wt 213 lb (96.6 kg)   BMI 30.56 kg/m  BP Readings from Last 3 Encounters:  01/14/21 127/73  09/02/20 135/74  08/02/20 (!) 142/84   Wt Readings from Last 3 Encounters:  01/14/21 213 lb (96.6 kg)  09/02/20 211 lb 12.8 oz (96.1 kg)  08/02/20 212 lb (96.2 kg)      Physical Exam Vitals reviewed.  Constitutional:      Appearance: Normal appearance.  HENT:     Head: Normocephalic and atraumatic.     Right Ear: Tympanic membrane and external ear normal.     Left Ear: Tympanic membrane and external ear normal.     Nose: Nose normal.     Mouth/Throat:     Pharynx: Oropharynx is clear.  Eyes:     Conjunctiva/sclera: Conjunctivae normal.  Neck:     Vascular: No carotid bruit.  Cardiovascular:     Rate and Rhythm: Normal rate and regular rhythm.     Pulses: Normal pulses.     Heart sounds: Normal heart sounds.  Pulmonary:     Effort: Pulmonary effort is normal.     Breath sounds: Normal breath sounds.  Abdominal:     Palpations: Abdomen is soft.  Genitourinary:    Penis: Normal.      Testes: Normal.  Musculoskeletal:     Right lower leg: No edema.     Left lower leg: No edema.  Lymphadenopathy:     Cervical: No cervical adenopathy.  Skin:    General: Skin is warm and dry.  Neurological:     General: No focal deficit present.     Mental Status: He is alert and oriented to person, place, and time.  Psychiatric:        Mood and Affect: Mood normal.         Behavior: Behavior normal.        Thought Content: Thought content normal.        Judgment: Judgment normal.      Last depression screening scores PHQ 2/9 Scores 01/14/2021 01/10/2020 04/26/2019  PHQ - 2 Score 1 0 0  PHQ- 9 Score 2  2 -   Last fall risk screening Fall Risk  01/14/2021  Falls in the past year? 0  Number falls in past yr: 0  Injury with Fall? 0  Risk for fall due to : No Fall Risks  Follow up Falls evaluation completed   Last Audit-C alcohol use screening Alcohol Use Disorder Test (AUDIT) 01/14/2021  1. How often do you have a drink containing alcohol? 3  2. How many drinks containing alcohol do you have on a typical day when you are drinking? 0  3. How often do you have six or more drinks on one occasion? 0  AUDIT-C Score 3  4. How often during the last year have you found that you were not able to stop drinking once you had started? 0  5. How often during the last year have you failed to do what was normally expected from you because of drinking? 0  6. How often during the last year have you needed a first drink in the morning to get yourself going after a heavy drinking session? 0  7. How often during the last year have you had a feeling of guilt of remorse after drinking? 0  8. How often during the last year have you been unable to remember what happened the night before because you had been drinking? 0  9. Have you or someone else been injured as a result of your drinking? 0  10. Has a relative or friend or a doctor or another health worker been concerned about your drinking or suggested you cut down? 0  Alcohol Use Disorder Identification Test Final Score (AUDIT) 3  Alcohol Brief Interventions/Follow-up -   A score of 3 or more in women, and 4 or more in men indicates increased risk for alcohol abuse, EXCEPT if all of the points are from question 1   No results found for any visits on 01/14/21.  Assessment & Plan    Routine Health Maintenance and Physical  Exam  Exercise Activities and Dietary recommendations  Goals   None     Immunization History  Administered Date(s) Administered   PFIZER(Purple Top)SARS-COV-2 Vaccination 09/23/2019, 10/17/2019   Tdap 06/23/2010    Health Maintenance  Topic Date Due   HIV Screening  Never done   Zoster Vaccines- Shingrix (1 of 2) Never done   COVID-19 Vaccine (3 - Booster for Pfizer series) 03/18/2020   TETANUS/TDAP  06/23/2020   INFLUENZA VACCINE  02/10/2021   COLONOSCOPY (Pts 45-4yrs Insurance coverage will need to be confirmed)  09/02/2021   Hepatitis C Screening  Completed   Pneumococcal Vaccine 70-43 Years old  Aged Out   HPV VACCINES  Aged Out    Discussed health benefits of physical activity, and encouraged him to engage in regular exercise appropriate for his age and condition.  1. Annual physical exam Overall health good.  Recommend diet and exercise with some weight loss.  Goal to lose about a pound a month over the next year. - CBC with Differential/Platelet - Comprehensive metabolic panel - Lipid panel - TSH  2. Screening for blood or protein in urine  - POCT urinalysis dipstick  3. Prostate cancer screening   4. Colon cancer screening Refer back to GI for colonoscopy early in 2023 - Ambulatory referral to Gastroenterology  5. Obstructive sleep apnea treated with continuous positive airway pressure (CPAP) On CPAP and managed by Dr. Brett Fairy  6. Pulmonary nodule Follow-up CT scan in February 2024 and then repeated 26 and this is  normal no follow-up indicated in this low risk pt.   No follow-ups on file.     I, Wilhemena Durie, MD, have reviewed all documentation for this visit. The documentation on 01/15/21 for the exam, diagnosis, procedures, and orders are all accurate and complete.    Mykle Cranford Mon, MD  [facility] [phone] (phone) [phone] (fax)  [place]

## 2021-01-17 ENCOUNTER — Other Ambulatory Visit: Payer: Self-pay

## 2021-01-17 DIAGNOSIS — R1310 Dysphagia, unspecified: Secondary | ICD-10-CM

## 2021-01-20 ENCOUNTER — Encounter: Payer: Self-pay | Admitting: Gastroenterology

## 2021-01-21 ENCOUNTER — Ambulatory Visit
Admission: RE | Admit: 2021-01-21 | Payer: Managed Care, Other (non HMO) | Source: Home / Self Care | Admitting: Gastroenterology

## 2021-01-21 ENCOUNTER — Encounter: Admission: RE | Payer: Self-pay | Source: Home / Self Care

## 2021-01-21 LAB — COMPREHENSIVE METABOLIC PANEL
ALT: 20 IU/L (ref 0–44)
AST: 18 IU/L (ref 0–40)
Albumin/Globulin Ratio: 1.8 (ref 1.2–2.2)
Albumin: 4.6 g/dL (ref 3.8–4.9)
Alkaline Phosphatase: 45 IU/L (ref 44–121)
BUN/Creatinine Ratio: 16 (ref 10–24)
BUN: 18 mg/dL (ref 8–27)
Bilirubin Total: 0.4 mg/dL (ref 0.0–1.2)
CO2: 23 mmol/L (ref 20–29)
Calcium: 9.6 mg/dL (ref 8.6–10.2)
Chloride: 104 mmol/L (ref 96–106)
Creatinine, Ser: 1.12 mg/dL (ref 0.76–1.27)
Globulin, Total: 2.6 g/dL (ref 1.5–4.5)
Glucose: 94 mg/dL (ref 65–99)
Potassium: 4.5 mmol/L (ref 3.5–5.2)
Sodium: 141 mmol/L (ref 134–144)
Total Protein: 7.2 g/dL (ref 6.0–8.5)
eGFR: 75 mL/min/{1.73_m2} (ref >59)

## 2021-01-21 LAB — CBC WITH DIFFERENTIAL/PLATELET
Basophils Absolute: 0.1 10*3/uL (ref 0.0–0.2)
Basos: 1 %
EOS (ABSOLUTE): 0.2 10*3/uL (ref 0.0–0.4)
Eos: 2 %
Hematocrit: 46.6 % (ref 37.5–51.0)
Hemoglobin: 15.8 g/dL (ref 13.0–17.7)
Immature Grans (Abs): 0 10*3/uL (ref 0.0–0.1)
Immature Granulocytes: 0 %
Lymphocytes Absolute: 1.5 10*3/uL (ref 0.7–3.1)
Lymphs: 16 %
MCH: 31.1 pg (ref 26.6–33.0)
MCHC: 33.9 g/dL (ref 31.5–35.7)
MCV: 92 fL (ref 79–97)
Monocytes Absolute: 1 10*3/uL — ABNORMAL HIGH (ref 0.1–0.9)
Monocytes: 11 %
Neutrophils Absolute: 6.3 10*3/uL (ref 1.4–7.0)
Neutrophils: 70 %
Platelets: 308 10*3/uL (ref 150–450)
RBC: 5.08 x10E6/uL (ref 4.14–5.80)
RDW: 12.6 % (ref 11.6–15.4)
WBC: 9.1 10*3/uL (ref 3.4–10.8)

## 2021-01-21 LAB — LIPID PANEL
Chol/HDL Ratio: 5.1 ratio — ABNORMAL HIGH (ref 0.0–5.0)
Cholesterol, Total: 225 mg/dL — ABNORMAL HIGH (ref 100–199)
HDL: 44 mg/dL (ref >39)
LDL Chol Calc (NIH): 159 mg/dL — ABNORMAL HIGH (ref 0–99)
Triglycerides: 120 mg/dL (ref 0–149)
VLDL Cholesterol Cal: 22 mg/dL (ref 5–40)

## 2021-01-21 LAB — TSH: TSH: 3.49 u[IU]/mL (ref 0.450–4.500)

## 2021-01-21 SURGERY — ESOPHAGOGASTRODUODENOSCOPY (EGD) WITH PROPOFOL
Anesthesia: General

## 2021-02-18 ENCOUNTER — Telehealth: Payer: Self-pay | Admitting: Gastroenterology

## 2021-02-18 NOTE — Telephone Encounter (Signed)
Patient's wife called [facility]) to r/s his EGD. They got confirmation through [facility] and are ready to reschedule his procedure.

## 2021-02-19 NOTE — Telephone Encounter (Signed)
Left vm for pt's wife to return my call to reschedule EGD.

## 2021-02-24 ENCOUNTER — Other Ambulatory Visit: Payer: Self-pay

## 2021-02-24 ENCOUNTER — Encounter: Payer: Self-pay | Admitting: Gastroenterology

## 2021-02-24 DIAGNOSIS — R1310 Dysphagia, unspecified: Secondary | ICD-10-CM

## 2021-02-24 NOTE — Telephone Encounter (Signed)
Pt scheduled for a repeat EGD with [facility] at [facility] on 02/28/21.

## 2021-02-28 ENCOUNTER — Other Ambulatory Visit: Payer: Self-pay

## 2021-02-28 ENCOUNTER — Encounter
Admission: RE | Disposition: A | Discharge status: Home / Self Care | Payer: Self-pay | Source: Home / Self Care | Attending: Gastroenterology

## 2021-02-28 ENCOUNTER — Ambulatory Visit: Payer: Managed Care, Other (non HMO) | Admitting: Anesthesiology

## 2021-02-28 ENCOUNTER — Encounter: Payer: Self-pay | Admitting: Gastroenterology

## 2021-02-28 ENCOUNTER — Ambulatory Visit
Admission: RE | Admit: 2021-02-28 | Discharge: 2021-02-28 | Disposition: A | Discharge status: Home / Self Care | Payer: Managed Care, Other (non HMO) | Attending: Gastroenterology | Admitting: Gastroenterology

## 2021-02-28 DIAGNOSIS — K219 Gastro-esophageal reflux disease without esophagitis: Secondary | ICD-10-CM | POA: Diagnosis not present

## 2021-02-28 DIAGNOSIS — Z8349 Family history of other endocrine, nutritional and metabolic diseases: Secondary | ICD-10-CM | POA: Diagnosis not present

## 2021-02-28 DIAGNOSIS — Z79899 Other long term (current) drug therapy: Secondary | ICD-10-CM | POA: Diagnosis not present

## 2021-02-28 DIAGNOSIS — Z801 Family history of malignant neoplasm of trachea, bronchus and lung: Secondary | ICD-10-CM | POA: Insufficient documentation

## 2021-02-28 DIAGNOSIS — R1310 Dysphagia, unspecified: Secondary | ICD-10-CM

## 2021-02-28 DIAGNOSIS — Z7982 Long term (current) use of aspirin: Secondary | ICD-10-CM | POA: Diagnosis not present

## 2021-02-28 DIAGNOSIS — Z8051 Family history of malignant neoplasm of kidney: Secondary | ICD-10-CM | POA: Insufficient documentation

## 2021-02-28 DIAGNOSIS — K222 Esophageal obstruction: Secondary | ICD-10-CM

## 2021-02-28 DIAGNOSIS — Z800 Family history of malignant neoplasm of digestive organs: Secondary | ICD-10-CM | POA: Insufficient documentation

## 2021-02-28 HISTORY — PX: ESOPHAGEAL DILATION: SHX303

## 2021-02-28 HISTORY — PX: ESOPHAGOGASTRODUODENOSCOPY (EGD) WITH PROPOFOL: SHX5813

## 2021-02-28 SURGERY — ESOPHAGOGASTRODUODENOSCOPY (EGD) WITH PROPOFOL
Anesthesia: General | Site: Throat

## 2021-02-28 MED ORDER — OXYCODONE HCL 5 MG/5ML PO SOLN: 5.0000 mg | Freq: Once | ORAL | Status: DC | PRN

## 2021-02-28 MED ORDER — SODIUM CHLORIDE 0.9 % IV SOLN: INTRAVENOUS | Status: DC

## 2021-02-28 MED ORDER — FENTANYL CITRATE PF 50 MCG/ML IJ SOSY: 25.0000 ug | PREFILLED_SYRINGE | INTRAMUSCULAR | Status: DC | PRN

## 2021-02-28 MED ORDER — GLYCOPYRROLATE 0.2 MG/ML IJ SOLN
INTRAMUSCULAR | Status: DC | PRN
Start: 2021-02-28 — End: 2021-02-28
  Administered 2021-02-28: .1 mg via INTRAVENOUS

## 2021-02-28 MED ORDER — OXYCODONE HCL 5 MG PO TABS: 5.0000 mg | ORAL_TABLET | Freq: Once | ORAL | Status: DC | PRN

## 2021-02-28 MED ORDER — PROMETHAZINE HCL 25 MG/ML IJ SOLN: 6.2500 mg | INTRAMUSCULAR | Status: DC | PRN

## 2021-02-28 MED ORDER — MEPERIDINE HCL 25 MG/ML IJ SOLN: 6.2500 mg | INTRAMUSCULAR | Status: DC | PRN

## 2021-02-28 MED ORDER — LACTATED RINGERS IV SOLN: INTRAVENOUS | Status: DC

## 2021-02-28 MED ORDER — PROPOFOL 10 MG/ML IV BOLUS
INTRAVENOUS | Status: DC | PRN
  Administered 2021-02-28: 40 mg via INTRAVENOUS
  Administered 2021-02-28 (×2): 100 mg via INTRAVENOUS

## 2021-02-28 MED ORDER — LIDOCAINE HCL (CARDIAC) PF 100 MG/5ML IV SOSY
PREFILLED_SYRINGE | INTRAVENOUS | Status: DC | PRN
  Administered 2021-02-28: 30 mg via INTRAVENOUS

## 2021-02-28 SURGICAL SUPPLY — 10 items
BALLN DILATOR 15-18 8 (BALLOONS) ×3
BALLOON DILATOR 15-18 8 (BALLOONS) ×2 IMPLANT
BLOCK BITE 60FR ADLT L/F GRN (MISCELLANEOUS) ×3 IMPLANT
GOWN CVR UNV OPN BCK APRN NK (MISCELLANEOUS) ×4 IMPLANT
GOWN ISOL THUMB LOOP REG UNIV (MISCELLANEOUS) ×6
KIT PRC NS LF DISP ENDO (KITS) ×2 IMPLANT
KIT PROCEDURE OLYMPUS (KITS) ×3
MANIFOLD NEPTUNE II (INSTRUMENTS) ×3 IMPLANT
SYR INFLATION 60ML (SYRINGE) ×3 IMPLANT
WATER STERILE IRR 250ML POUR (IV SOLUTION) ×3 IMPLANT

## 2021-02-28 NOTE — Op Note (Signed)
[facility] Patient Name: Christopher Parsons Procedure Date: 02/28/2021 11:55 AM MRN: [id number] Account #: [id number] Date of Birth: 10/08/1960 Admit Type: Outpatient Age: 60 Room: [facility] OR ROOM 01 Gender: Male Note Status: Finalized Procedure:             Upper GI endoscopy Indications:           Dysphagia Providers:             [name] MD, MD Referring MD:          Janine Ores. Rosanna Randy, MD (Referring MD) Medicines:             Propofol per Anesthesia Complications:         No immediate complications. Procedure:             Pre-Anesthesia Assessment:                        - Prior to the procedure, a History and Physical was                         performed, and patient medications and allergies were                         reviewed. The patient's tolerance of previous                         anesthesia was also reviewed. The risks and benefits                         of the procedure and the sedation options and risks                         were discussed with the patient. All questions were                         answered, and informed consent was obtained. Prior                         Anticoagulants: The patient has taken no previous                         anticoagulant or antiplatelet agents. ASA Grade                         Assessment: II - A patient with mild systemic disease.                         After reviewing the risks and benefits, the patient                         was deemed in satisfactory condition to undergo the                         procedure.                        After obtaining informed consent, the endoscope was  passed under direct vision. Throughout the procedure,                         the patient's blood pressure, pulse, and oxygen                         saturations were monitored continuously. The Endoscope                         was introduced through the mouth, and advanced to the                          second part of duodenum. The upper GI endoscopy was                         accomplished without difficulty. The patient tolerated                         the procedure well. Findings:      One benign-appearing, intrinsic mild stenosis was found at the       gastroesophageal junction. The stenosis was traversed. A TTS dilator was       passed through the scope. Dilation with a 15-16.5-18 mm balloon dilator       was performed to 18 mm. The dilation site was examined following       endoscope reinsertion and showed complete resolution of luminal       narrowing.      The stomach was normal.      The examined duodenum was normal. Impression:            - Benign-appearing esophageal stenosis. Dilated.                        - Normal stomach.                        - Normal examined duodenum.                        - No specimens collected. Recommendation:        - Discharge patient to home.                        - Resume previous diet.                        - Continue present medications.                        - Repeat upper endoscopy PRN for retreatment. Procedure Code(s):     --- Professional ---                        [phone], Esophagogastroduodenoscopy, flexible,                         transoral; with transendoscopic balloon dilation of                         esophagus (less than 30 mm diameter) Diagnosis Code(s):     --- Professional ---  R13.10, Dysphagia, unspecified                        K22.2, Esophageal obstruction CPT copyright 2019 American Medical Association. All rights reserved. The codes documented in this report are preliminary and upon coder review may  be revised to meet current compliance requirements. [name] MD, MD 02/28/2021 12:12:47 PM This report has been signed electronically. Number of Addenda: 0 Note Initiated On: 02/28/2021 11:55 AM Total Procedure Duration: 0 hours 3 minutes 59 seconds  Estimated Blood  Loss:  Estimated blood loss: none.      [facility]

## 2021-02-28 NOTE — H&P (Signed)
[name], MD [address] [place], [place] [address] [phone] Fax : [phone]  Primary Care Physician:  Jerrol Banana., MD Primary Gastroenterologist:  Dr. Allen Norris  Pre-Procedure History & Physical: HPI:  Christopher Parsons is a 60 y.o. male is here for an endoscopy.   Past Medical History:  Diagnosis Date   GERD (gastroesophageal reflux disease)    Hypothyroidism    Mitral valve prolapse    echo 12/14 on paper chart   OSA on CPAP     Past Surgical History:  Procedure Laterality Date   COLONOSCOPY     ESOPHAGEAL DILATION N/A 01/27/2018   Procedure: ESOPHAGEAL DILATION;  Surgeon: [name], MD;  Location: [place];  Service: Endoscopy;  Laterality: N/A;   ESOPHAGOGASTRODUODENOSCOPY     ESOPHAGOGASTRODUODENOSCOPY (EGD) WITH PROPOFOL N/A 01/18/2015   Procedure: ESOPHAGOGASTRODUODENOSCOPY (EGD) WITH PROPOFOL with dialtion;  Surgeon: [name], MD;  Location: [place];  Service: Endoscopy;  Laterality: N/A;   ESOPHAGOGASTRODUODENOSCOPY (EGD) WITH PROPOFOL N/A 05/17/2015   Procedure: ESOPHAGOGASTRODUODENOSCOPY (EGD) WITH PROPOFOL, WITH DIALATION;  Surgeon: [name], MD;  Location: [place];  Service: Endoscopy;  Laterality: N/A;   ESOPHAGOGASTRODUODENOSCOPY (EGD) WITH PROPOFOL N/A 12/10/2017   Procedure: ESOPHAGOGASTRODUODENOSCOPY (EGD) WITH PROPOFOL;  Surgeon: [name], MD;  Location: [place];  Service: Endoscopy;  Laterality: N/A;   ESOPHAGOGASTRODUODENOSCOPY (EGD) WITH PROPOFOL N/A 01/27/2018   Procedure: ESOPHAGOGASTRODUODENOSCOPY (EGD) WITH PROPOFOL;  Surgeon: [name], MD;  Location: [place];  Service: Endoscopy;  Laterality: N/A;   GANGLION CYST EXCISION Left    wrist   TONSILLECTOMY     VASECTOMY      Prior to Admission medications   Medication Sig Start Date End Date Taking? Authorizing Provider  aspirin 81 MG tablet Take 81 mg by mouth daily.   Yes [provider]  Multiple Vitamin (MULTIVITAMIN) tablet Take 1 tablet by mouth daily.   Yes [provider]  omeprazole (PRILOSEC) 40 MG capsule TAKE 1 CAPSULE BY MOUTH  DAILY 01/20/20  Yes Jerrol Banana., MD  silodosin (RAPAFLO) 8 MG CAPS capsule Take 1 capsule (8 mg total) by mouth daily with breakfast. Patient not taking: Reported on 02/24/2021 01/02/21   Debroah Loop, PA-C    Allergies as of 02/24/2021   (No Known Allergies)    Family History  Problem Relation Age of Onset   Hyperlipidemia Mother    Cancer Father 60       lung cancer   Kidney cancer Father        Underwent nephrectomy   Throat cancer Father    Healthy Sister    Healthy Brother    Healthy Brother     Social History   Socioeconomic History   Marital status: Married    Spouse name: Not on file   Number of children: 3   Years of education: 12   Highest education level: Not on file  Occupational History   Not on file  Tobacco Use   Smoking status: Never   Smokeless tobacco: Never  Vaping Use   Vaping Use: Never used  Substance and Sexual Activity   Alcohol use: Yes    Alcohol/week: 5.0 standard drinks    Types: 5 Shots of liquor per week    Comment: 1-2 drinks daily   Drug use: No   Sexual activity: Yes    Birth control/protection: Surgical    Comment: vasectomy  Other Topics Concern   Not on  file  Social History Narrative   Not on file   Social Determinants of Health   Financial Resource Strain: Not on file  Food Insecurity: Not on file  Transportation Needs: Not on file  Physical Activity: Not on file  Stress: Not on file  Social Connections: Not on file  Intimate Partner Violence: Not on file    Review of Systems: See HPI, otherwise negative ROS  Physical Exam: BP 131/79   Pulse (!) 57   Temp (!) 97.5 F (36.4 C)   Ht '5\' 10"'$  (1.778 m)   Wt 95.7 kg   SpO2 100%   BMI 30.28 kg/m  General:   Alert,  pleasant and cooperative in NAD Head:  Normocephalic  and atraumatic. Neck:  Supple; no masses or thyromegaly. Lungs:  Clear throughout to auscultation.    Heart:  Regular rate and rhythm. Abdomen:  Soft, nontender and nondistended. Normal bowel sounds, without guarding, and without rebound.   Neurologic:  Alert and  oriented x4;  grossly normal neurologically.  Impression/Plan: Christopher Parsons is here for an endoscopy to be performed for dysphagia  Risks, benefits, limitations, and alternatives regarding  endoscopy have been reviewed with the patient.  Questions have been answered.  All parties agreeable.   [name], MD  02/28/2021, 11:33 AM

## 2021-02-28 NOTE — Anesthesia Procedure Notes (Signed)
Date/Time: 02/28/2021 12:04 PM Performed by: Cameron Ali, CRNA Pre-anesthesia Checklist: Patient identified, Emergency Drugs available, Suction available, Timeout performed and Patient being monitored Patient Re-evaluated:Patient Re-evaluated prior to induction Oxygen Delivery Method: Nasal cannula Placement Confirmation: positive ETCO2

## 2021-02-28 NOTE — Anesthesia Postprocedure Evaluation (Signed)
Anesthesia Post Note  Patient: Christopher Parsons  Procedure(s) Performed: ESOPHAGOGASTRODUODENOSCOPY (EGD) WITH PROPOFOL (Throat) ESOPHAGEAL DILATION (Throat)     Patient location during evaluation: PACU Anesthesia Type: General Level of consciousness: awake and alert Pain management: pain level controlled Vital Signs Assessment: post-procedure vital signs reviewed and stable Respiratory status: spontaneous breathing, nonlabored ventilation, respiratory function stable and patient connected to nasal cannula oxygen Cardiovascular status: blood pressure returned to baseline and stable Postop Assessment: no apparent nausea or vomiting Anesthetic complications: no   No notable events documented.  Elinda Bunten, [place]

## 2021-02-28 NOTE — Transfer of Care (Signed)
Immediate Anesthesia Transfer of Care Note  Patient: Christopher Parsons  Procedure(s) Performed: ESOPHAGOGASTRODUODENOSCOPY (EGD) WITH PROPOFOL (Throat) ESOPHAGEAL DILATION (Throat)  Patient Location: PACU  Anesthesia Type: General  Level of Consciousness: awake, alert  and patient cooperative  Airway and Oxygen Therapy: Patient Spontanous Breathing and Patient connected to supplemental oxygen  Post-op Assessment: Post-op Vital signs reviewed, Patient's Cardiovascular Status Stable, Respiratory Function Stable, Patent Airway and No signs of Nausea or vomiting  Post-op Vital Signs: Reviewed and stable  Complications: No notable events documented.

## 2021-02-28 NOTE — Anesthesia Preprocedure Evaluation (Signed)
Anesthesia Evaluation  Patient identified by MRN, date of birth, ID band Patient awake    Reviewed: Allergy & Precautions, H&P , NPO status , Patient's Chart, lab work & pertinent test results, reviewed documented beta blocker date and time   Airway Mallampati: II  TM Distance: >3 FB Neck ROM: full    Dental no notable dental hx.    Pulmonary sleep apnea ,    Pulmonary exam normal breath sounds clear to auscultation       Cardiovascular Exercise Tolerance: Good + Valvular Problems/Murmurs  Rhythm:regular Rate:Normal     Neuro/Psych negative neurological ROS  negative psych ROS   GI/Hepatic Neg liver ROS, GERD  ,  Endo/Other  Hypothyroidism   Renal/GU negative Renal ROS  negative genitourinary   Musculoskeletal   Abdominal   Peds  Hematology negative hematology ROS (+)   Anesthesia Other Findings   Reproductive/Obstetrics negative OB ROS                             Anesthesia Physical Anesthesia Plan  ASA: 2  Anesthesia Plan: General   Post-op Pain Management:    Induction:   PONV Risk Score and Plan: 2 and Propofol infusion, TIVA and Treatment may vary due to age or medical condition  Airway Management Planned:   Additional Equipment:   Intra-op Plan:   Post-operative Plan:   Informed Consent: I have reviewed the patients History and Physical, chart, labs and discussed the procedure including the risks, benefits and alternatives for the proposed anesthesia with the patient or authorized representative who has indicated his/her understanding and acceptance.     Dental Advisory Given  Plan Discussed with: CRNA  Anesthesia Plan Comments:         Anesthesia Quick Evaluation

## 2021-03-03 ENCOUNTER — Encounter: Payer: Self-pay | Admitting: Gastroenterology

## 2021-04-29 NOTE — Patient Instructions (Addendum)

## 2021-04-29 NOTE — Progress Notes (Signed)
PATIENT: Christopher Parsons DOB: [birth year]  REASON FOR VISIT: follow up HISTORY FROM: patient  Chief Complaint  Patient presents with   Follow-up    Pt alone, rm 11. Doing well. Here for yearly f/u. Overall pt states things are going well with machine and he has no issues or concerns. DME- [employer]       HISTORY OF PRESENT ILLNESS: 04/30/21 ALL: Christopher Parsons returns for CPAP follow up. He continues to do well on therapy. He can tell a significant difference in sleep quality and daytime energy levels if he does not use CPAP. He denies concerns with machine or supplies. Set up in 2018. He has noted fluctuating AHI on his mobile app.     04/30/2020 ALL:  Christopher Parsons is a 60 y.o. male here today for follow up for OSA on CPAP. He is doing very well on therapy. He is using a full face mask and feels it is the best fit for him. He monitors for leak at home but has not been able to identify specific contributors. He does have facial hair that could contribute to leak. He is feeling great and notes benefit of CPAP therapy.   Compliance report dated 03/30/2020 to 04/28/2020 reveals he has used CPAP 30 of the past 30 days for compliance of 100%.  He is CPAP greater than 4 hours 28 of the past 30 days for compliance of 93%.  Average usage was 6 hours and 44 minutes.  Residual AHI was 6.1 on 10 to 16 cm of water pressure and an EPR of 2.  There was a significant leak noted in the 95th percentile of 33.7.  HISTORY: (copied from Dr Dohmeier's note on 05/01/2019)  UPDATE 05-01-2019, Christopher Parsons, a meanwhile 60 year old male returns for CPAP Compliance.  He reports having a good experience most nights , having good air seals. Many nights there is a problem with the air seal, and he has facial hair and is a mouth breathier as well. BMI 30 kg/m2. He lost 12 pounds.   He has been 100% compliant CPAP user and 90% by time for the 30 days preceding 26 April 2019.  Average use at home is 6  hours and 40 minutes the pressure is set at 12 cmH2O of a 3 cm EPR also his machine allows auto titration.  The residual AHI has risen to 9.6/h and the vast majority of these events is obstructive in nature.  I do think this is possibly at least due to higher air leaks just over the last 14 days in comparison to the previous 15 days I would much like to increase the pressure slightly in order to control the obstructive apneas, hopefully not increasing central apneas in the process.    REVIEW OF SYSTEMS: Out of a complete 14 system review of symptoms, the patient complains only of the following symptoms, none and all other reviewed systems are negative.  ESS:7  ALLERGIES: No Known Allergies  HOME MEDICATIONS: Outpatient Medications Prior to Visit  Medication Sig Dispense Refill   aspirin 81 MG tablet Take 81 mg by mouth daily.     Multiple Vitamin (MULTIVITAMIN) tablet Take 1 tablet by mouth daily.     omeprazole (PRILOSEC) 40 MG capsule TAKE 1 CAPSULE BY MOUTH  DAILY 90 capsule 3   silodosin (RAPAFLO) 8 MG CAPS capsule Take 1 capsule (8 mg total) by mouth daily with breakfast. (Patient not taking: Reported on 02/24/2021) 30 capsule 11  No facility-administered medications prior to visit.    PAST MEDICAL HISTORY: Past Medical History:  Diagnosis Date   GERD (gastroesophageal reflux disease)    Hypothyroidism    Mitral valve prolapse    echo 12/14 on paper chart   OSA on CPAP     PAST SURGICAL HISTORY: Past Surgical History:  Procedure Laterality Date   COLONOSCOPY     ESOPHAGEAL DILATION N/A 01/27/2018   Procedure: ESOPHAGEAL DILATION;  Surgeon: [name], MD;  Location: [place];  Service: Endoscopy;  Laterality: N/A;   ESOPHAGEAL DILATION  02/28/2021   Procedure: ESOPHAGEAL DILATION;  Surgeon: [name], MD;  Location: [place];  Service: Endoscopy;;   ESOPHAGOGASTRODUODENOSCOPY     ESOPHAGOGASTRODUODENOSCOPY (EGD) WITH PROPOFOL N/A 01/18/2015    Procedure: ESOPHAGOGASTRODUODENOSCOPY (EGD) WITH PROPOFOL with dialtion;  Surgeon: [name], MD;  Location: [place];  Service: Endoscopy;  Laterality: N/A;   ESOPHAGOGASTRODUODENOSCOPY (EGD) WITH PROPOFOL N/A 05/17/2015   Procedure: ESOPHAGOGASTRODUODENOSCOPY (EGD) WITH PROPOFOL, WITH DIALATION;  Surgeon: [name], MD;  Location: [place];  Service: Endoscopy;  Laterality: N/A;   ESOPHAGOGASTRODUODENOSCOPY (EGD) WITH PROPOFOL N/A 12/10/2017   Procedure: ESOPHAGOGASTRODUODENOSCOPY (EGD) WITH PROPOFOL;  Surgeon: [name], MD;  Location: [place];  Service: Endoscopy;  Laterality: N/A;   ESOPHAGOGASTRODUODENOSCOPY (EGD) WITH PROPOFOL N/A 01/27/2018   Procedure: ESOPHAGOGASTRODUODENOSCOPY (EGD) WITH PROPOFOL;  Surgeon: [name], MD;  Location: [place];  Service: Endoscopy;  Laterality: N/A;   ESOPHAGOGASTRODUODENOSCOPY (EGD) WITH PROPOFOL N/A 02/28/2021   Procedure: ESOPHAGOGASTRODUODENOSCOPY (EGD) WITH PROPOFOL;  Surgeon: [name], MD;  Location: [place];  Service: Endoscopy;  Laterality: N/A;   GANGLION CYST EXCISION Left    wrist   TONSILLECTOMY     VASECTOMY      FAMILY HISTORY: Family History  Problem Relation Age of Onset   Hyperlipidemia Mother    Cancer Father 80       lung cancer   Kidney cancer Father        Underwent nephrectomy   Throat cancer Father    Healthy Sister    Healthy Brother    Healthy Brother     SOCIAL HISTORY: Social History   Socioeconomic History   Marital status: Married    Spouse name: Not on file   Number of children: 3   Years of education: 12   Highest education level: Not on file  Occupational History   Not on file  Tobacco Use   Smoking status: Never   Smokeless tobacco: Never  Vaping Use   Vaping Use: Never used  Substance and Sexual Activity   Alcohol use: Yes    Alcohol/week: 5.0 standard drinks    Types: 5 Shots of liquor per week    Comment: 1-2 drinks daily    Drug use: No   Sexual activity: Yes    Birth control/protection: Surgical    Comment: vasectomy  Other Topics Concern   Not on file  Social History Narrative   Not on file   Social Determinants of Health   Financial Resource Strain: Not on file  Food Insecurity: Not on file  Transportation Needs: Not on file  Physical Activity: Not on file  Stress: Not on file  Social Connections: Not on file  Intimate Partner Violence: Not on file     PHYSICAL EXAM  Vitals:   04/30/21 0819  BP: 132/78  Pulse: (!) 59  Weight: 215 lb (97.5 kg)  Height: 5\' 10"  (1.778 m)  Body mass index is 30.85 kg/m.  Generalized: Well developed, in no acute distress  Cardiology: normal rate and rhythm, no murmur noted Respiratory: clear to auscultation bilaterally  Neurological examination  Mentation: Alert oriented to time, place, history taking. Follows all commands speech and language fluent Cranial nerve II-XII: Pupils were equal round reactive to light. Extraocular movements were full, visual field were full  Motor: The motor testing reveals 5 over 5 strength of all 4 extremities. Good symmetric motor tone is noted throughout.  Gait and station: Gait is normal.    DIAGNOSTIC DATA (LABS, IMAGING, TESTING) - I reviewed patient records, labs, notes, testing and imaging myself where available.  No flowsheet data found.   Lab Results  Component Value Date   WBC 9.1 01/20/2021   HGB 15.8 01/20/2021   HCT 46.6 01/20/2021   MCV 92 01/20/2021   PLT 308 01/20/2021      Component Value Date/Time   NA 141 01/20/2021 0803   K 4.5 01/20/2021 0803   CL 104 01/20/2021 0803   CO2 23 01/20/2021 0803   GLUCOSE 94 01/20/2021 0803   BUN 18 01/20/2021 0803   CREATININE 1.12 01/20/2021 0803   CALCIUM 9.6 01/20/2021 0803   PROT 7.2 01/20/2021 0803   ALBUMIN 4.6 01/20/2021 0803   AST 18 01/20/2021 0803   ALT 20 01/20/2021 0803   ALKPHOS 45 01/20/2021 0803   BILITOT 0.4 01/20/2021 0803   GFRNONAA  77 01/12/2020 0821   GFRAA 89 01/12/2020 0821   Lab Results  Component Value Date   CHOL 225 (H) 01/20/2021   HDL 44 01/20/2021   LDLCALC 159 (H) 01/20/2021   TRIG 120 01/20/2021   CHOLHDL 5.1 (H) 01/20/2021   No results found for: HGBA1C No results found for: VITAMINB12 Lab Results  Component Value Date   TSH 3.490 01/20/2021     ASSESSMENT AND PLAN 60 y.o. year old male  has a past medical history of GERD (gastroesophageal reflux disease), Hypothyroidism, Mitral valve prolapse, and OSA on CPAP. here with     ICD-10-CM   1. Obstructive sleep apnea treated with continuous positive airway pressure (CPAP)  G47.33 For home use only DME continuous positive airway pressure (CPAP)   Z99.89 For home use only DME continuous positive airway pressure (CPAP)        Christopher Parsons is doing well on CPAP therapy.  Compliance report reveals excellent compliance.  Residual AHI is now 6. I will increase maximum pressure form 16 to 18cmH20. He was encouraged to continue using CPAP nightly and for greater than 4 hours each night. We will update supply orders as indicated. Risks of untreated sleep apnea review and education materials provided. Healthy lifestyle habits encouraged. He will follow up in 1 year, sooner if needed. He verbalizes understanding and agreement with this plan.    Orders Placed This Encounter  Procedures   For home use only DME continuous positive airway pressure (CPAP)    Supplies    Order Specific Question:   Length of Need    Answer:   Lifetime    Order Specific Question:   Patient has OSA or probable OSA    Answer:   Yes    Order Specific Question:   Is the patient currently using CPAP in the home    Answer:   Yes    Order Specific Question:   Settings    Answer:   Other see comments    Order Specific Question:   CPAP supplies  needed    Answer:   Mask, headgear, cushions, filters, heated tubing and water chamber   For home use only DME continuous positive  airway pressure (CPAP)    Adjust max pressure to from 16 to 18cmH20. NP adjusted via Maryfrances Bunnell    Order Specific Question:   Length of Need    Answer:   Lifetime    Order Specific Question:   Patient has OSA or probable OSA    Answer:   Yes    Order Specific Question:   Is the patient currently using CPAP in the home    Answer:   Yes    Order Specific Question:   Settings    Answer:   Other see comments    Order Specific Question:   CPAP supplies needed    Answer:   Mask, headgear, cushions, filters, heated tubing and water chamber      No orders of the defined types were placed in this encounter.    Debbora Presto, FNP-C 04/30/2021, 8:46 AM [facility] [address], [place],  [address] [phone]

## 2021-04-30 ENCOUNTER — Ambulatory Visit: Payer: Managed Care, Other (non HMO) | Admitting: Family Medicine

## 2021-04-30 ENCOUNTER — Encounter: Payer: Self-pay | Admitting: Family Medicine

## 2021-04-30 ENCOUNTER — Other Ambulatory Visit: Payer: Self-pay

## 2021-04-30 VITALS — BP 132/78 | HR 59 | Ht 70.0 in | Wt 215.0 lb

## 2021-04-30 DIAGNOSIS — G4733 Obstructive sleep apnea (adult) (pediatric): Secondary | ICD-10-CM | POA: Diagnosis not present

## 2021-04-30 DIAGNOSIS — Z9989 Dependence on other enabling machines and devices: Secondary | ICD-10-CM | POA: Diagnosis not present

## 2021-06-02 IMAGING — CT CT ABD-PEL WO/W CM
3 of 12 series · 12 of 46 positions shown, 18 images · IV contrast (omnipaque)
Comparison: None.

CLINICAL DATA: Microscopic hematuria.

EXAM:
CT ABDOMEN AND PELVIS WITHOUT AND WITH CONTRAST
TECHNIQUE: Multidetector CT imaging of the abdomen and pelvis was performed
following the standard protocol before and following the bolus
administration of intravenous contrast.
CONTRAST:  125mL OMNIPAQUE IOHEXOL 300 MG/ML  SOLN

[Series 2: abd without pre 5.00 · axial · non-contrast · 0.75mm/px · z∈[-1566,-1321]mm · 5 of 100 slices shown]
[im 13/100  soft-tissue]
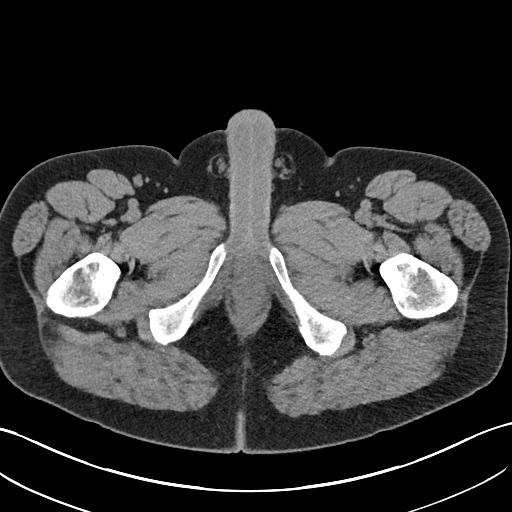
[im 25/100  soft-tissue]
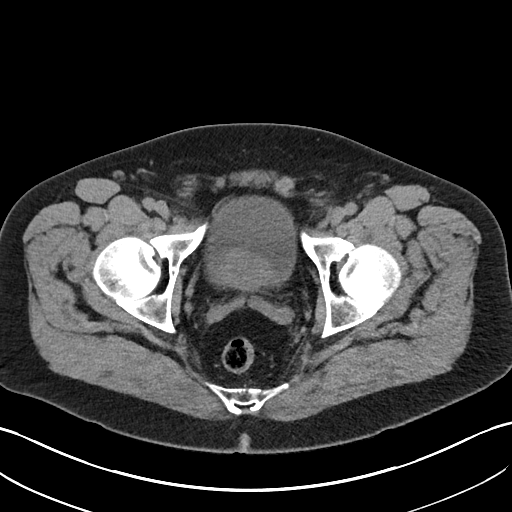
[im 38/100  soft-tissue]
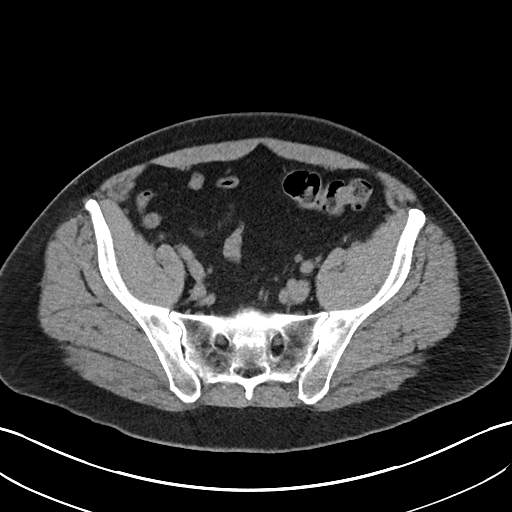
[im 50/100  soft-tissue]
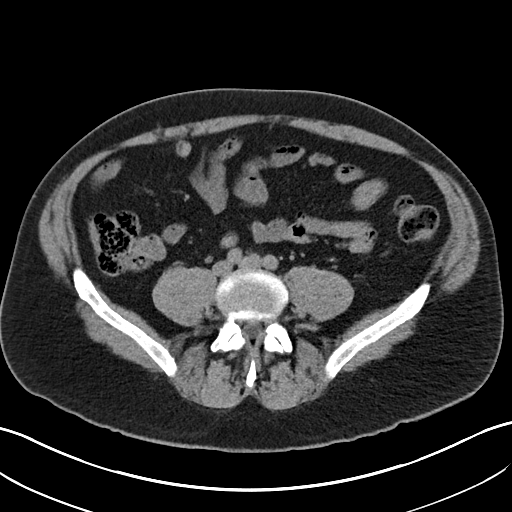
[im 62/100  soft-tissue]
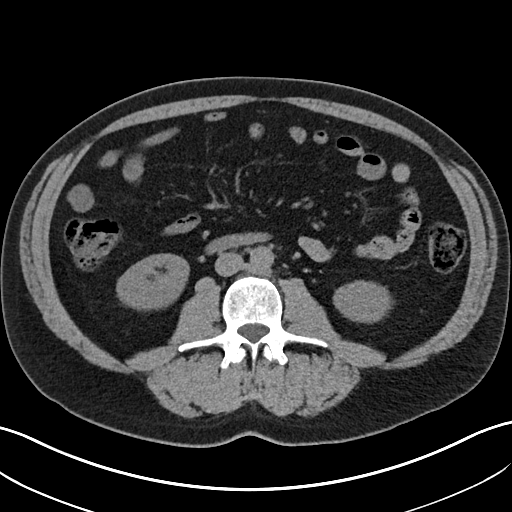

[Series 5: cor without without pre 2.00 cor · coronal · non-contrast · 0.75mm/px · 1 of 150 slices shown, 2 images]
[im 75/150  soft-tissue]
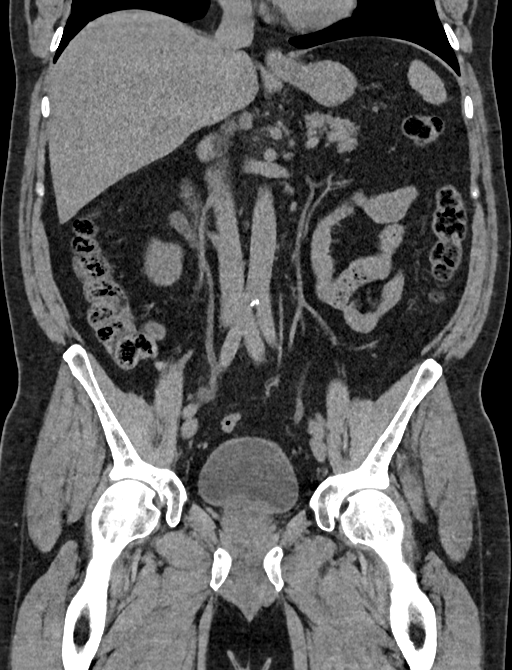
[im 75/150  bone]
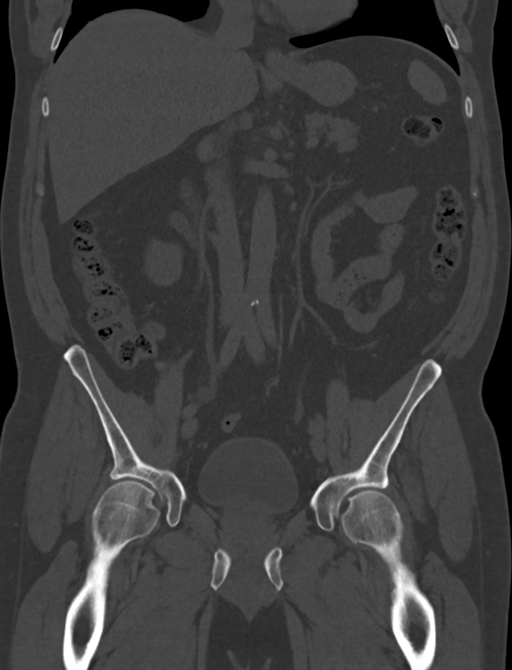

[Series 17: axial delay delay prone 5.00 · axial · delayed · 0.74mm/px · z∈[-1424,-1064]mm · 6 of 102 slices shown, 11 images]
[im 15/102  soft-tissue]
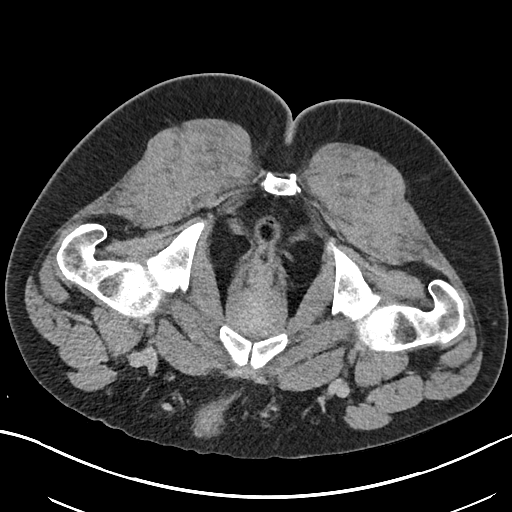
[im 15/102  bone]
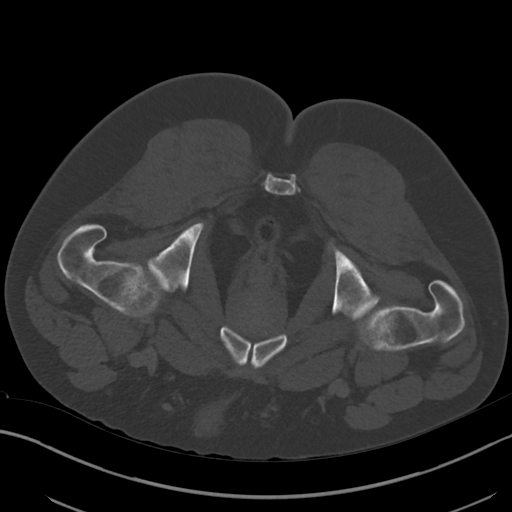
[im 29/102  soft-tissue]
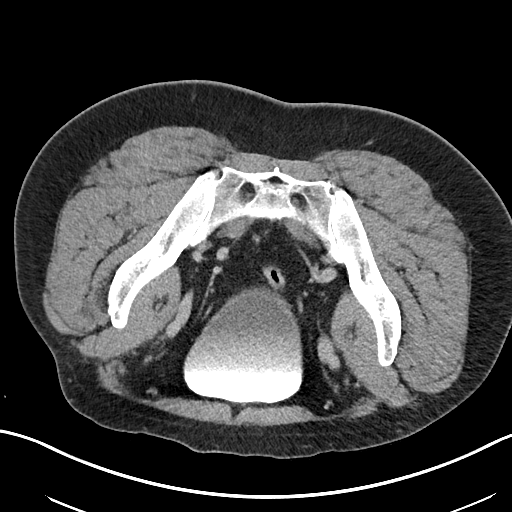
[im 44/102  soft-tissue]
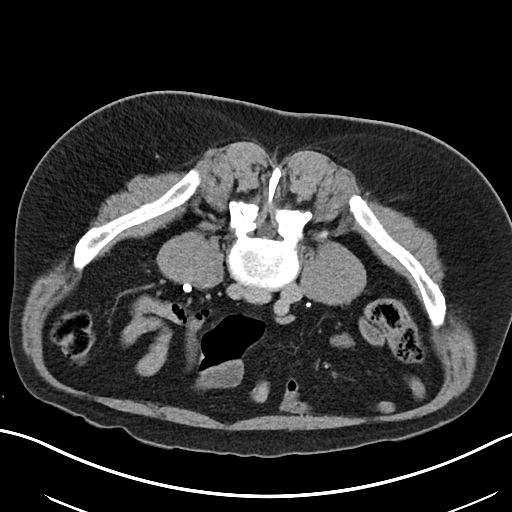
[im 44/102  lung]
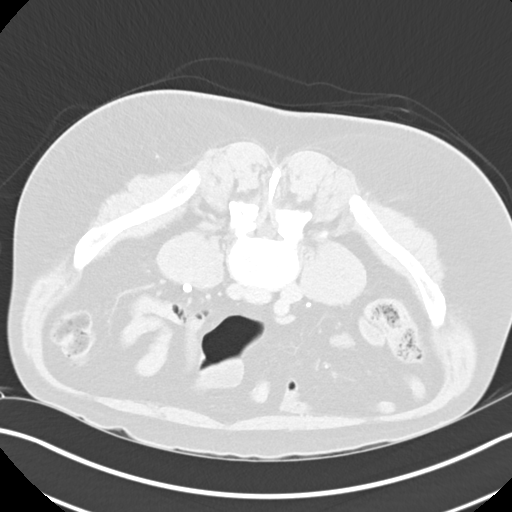
[im 58/102  soft-tissue]
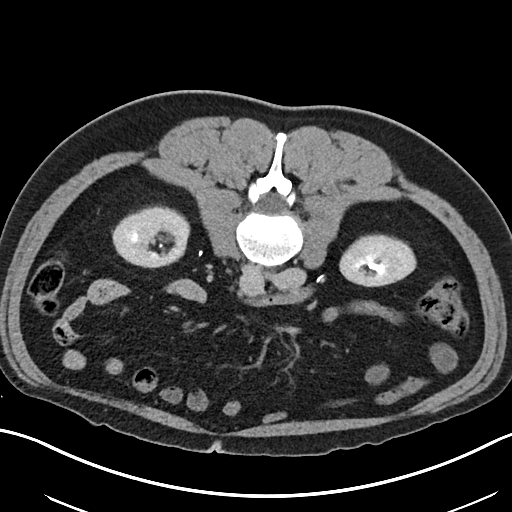
[im 58/102  lung]
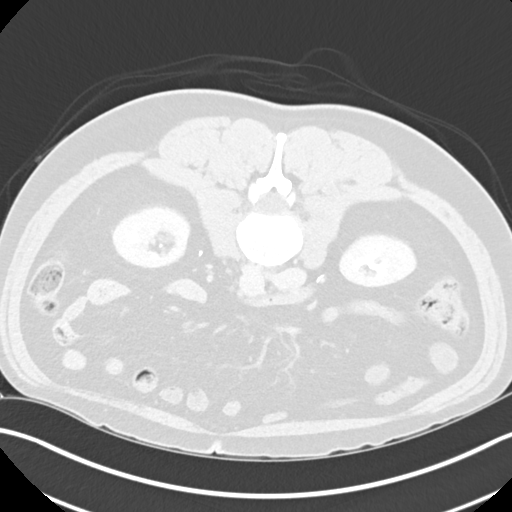
[im 73/102  soft-tissue]
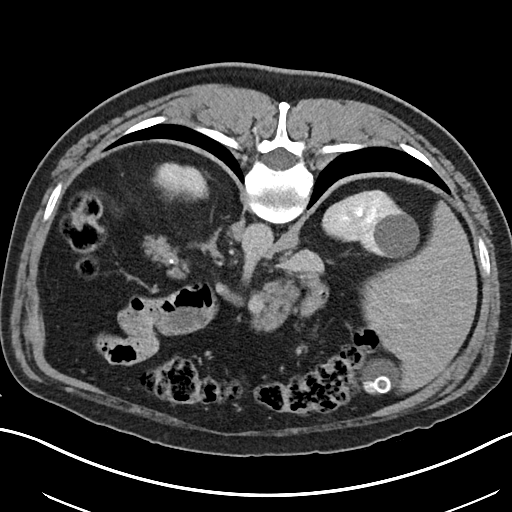
[im 73/102  lung]
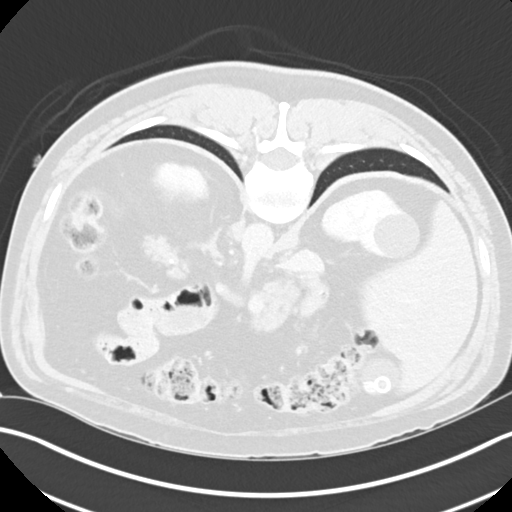
[im 87/102  soft-tissue]
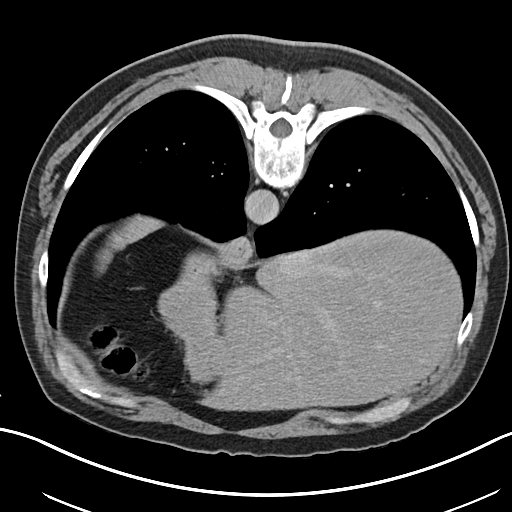
[im 87/102  lung]
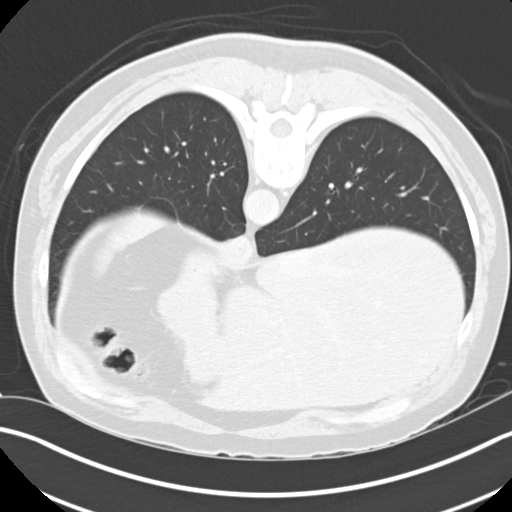

[12 of 46 positions shown; findings below may reference images not displayed]

FINDINGS: Lower Chest: An 8 mm ground-glass nodule is seen in the posterior
right lower lobe on image 14/19.

Hepatobiliary: No hepatic masses identified. Gallstones are seen,
however there is no evidence of cholecystitis or biliary dilatation.

Pancreas:  No mass or inflammatory changes.

Spleen: Within normal limits in size and appearance.

Adrenals/Urinary Tract: No adrenal masses identified. No evidence of
urolithiasis or hydronephrosis. 3.3 cm benign Bosniak category 1
cyst is seen in the upper pole the right kidney. No complex cystic
or solid renal masses identified. No masses seen involving the
ureters or bladder.

Stomach/Bowel: Small diverticulum is seen arising from the posterior
wall of the gastric fundus. No evidence of obstruction, inflammatory
process or abnormal fluid collections.

Vascular/Lymphatic: No pathologically enlarged lymph nodes. No
abdominal aortic aneurysm.

Reproductive: Mildly enlarged prostate gland noted. Symmetric
seminal vesicles.

Other:  None.

Musculoskeletal:  No suspicious bone lesions identified.
IMPRESSION: No radiographic evidence of urinary tract neoplasm, urolithiasis, or
hydronephrosis.

Mildly enlarged prostate.

Cholelithiasis. No radiographic evidence of cholecystitis.

8 mm ground-glass nodule in posterior right lower lobe. Initial
follow-up with CT at 6-12 months is recommended to confirm
persistence. If persistent, repeat CT is recommended every 2 years
until 5 years of stability has been established. This recommendation
follows the consensus statement: Guidelines for Management of
Incidental Pulmonary Nodules Detected on CT Images: From the

## 2021-07-02 ENCOUNTER — Encounter: Payer: Self-pay | Admitting: Family Medicine

## 2021-08-29 ENCOUNTER — Other Ambulatory Visit: Payer: Self-pay

## 2021-08-29 ENCOUNTER — Other Ambulatory Visit: Payer: Managed Care, Other (non HMO)

## 2021-08-29 DIAGNOSIS — N401 Enlarged prostate with lower urinary tract symptoms: Secondary | ICD-10-CM

## 2021-08-29 DIAGNOSIS — R3916 Straining to void: Secondary | ICD-10-CM

## 2021-08-30 LAB — PSA: Prostate Specific Ag, Serum: 5.3 ng/mL — ABNORMAL HIGH (ref 0.0–4.0)

## 2021-09-03 ENCOUNTER — Other Ambulatory Visit: Payer: Self-pay

## 2021-09-03 ENCOUNTER — Ambulatory Visit: Payer: Managed Care, Other (non HMO) | Admitting: Urology

## 2021-09-03 ENCOUNTER — Encounter: Payer: Self-pay | Admitting: Urology

## 2021-09-03 VITALS — BP 131/78 | HR 63 | Ht 70.0 in | Wt 219.1 lb

## 2021-09-03 DIAGNOSIS — R972 Elevated prostate specific antigen [PSA]: Secondary | ICD-10-CM | POA: Diagnosis not present

## 2021-09-03 DIAGNOSIS — N401 Enlarged prostate with lower urinary tract symptoms: Secondary | ICD-10-CM | POA: Diagnosis not present

## 2021-09-03 DIAGNOSIS — R3916 Straining to void: Secondary | ICD-10-CM

## 2021-09-03 LAB — BLADDER SCAN AMB NON-IMAGING

## 2021-09-03 NOTE — Patient Instructions (Signed)
Prostate Cancer Screening Prostate cancer screening is testing that is done to check for the presence of prostate cancer in men. The prostate gland is a walnut-sized gland that is located below the bladder and in front of the rectum in males. The function of the prostate is to add fluid to semen during ejaculation. Prostate cancer is one of the most common types of cancer in men. Who should have prostate cancer screening? Screening recommendations vary based on age and other risk factors, as well as between the professional organizations who make the recommendations. In general, screening is recommended if: You are age 64 to 78 and have an average risk for prostate cancer. You should talk with your health care provider about your need for screening and how often screening should be done. Because most prostate cancers are slow growing and will not cause death, screening in this age group is generally reserved for men who have a 25- to 15-year life expectancy. You are younger than age 66, and you have these risk factors: Having a father, brother, or uncle who has been diagnosed with prostate cancer. The risk is higher if your family member's cancer occurred at an early age or if you have multiple family members with prostate cancer at an early age. Being a male who is [place] or is of [place] or sub-Saharan African descent. In general, screening is not recommended if: You are younger than age 58. You are between the ages of 38 and 7 and you have no risk factors. You are 80 years of age or older. At this age, the risks that screening can cause are greater than the benefits that it may provide. If you are at high risk for prostate cancer, your health care provider may recommend that you have screenings more often or that you start screening at a younger age. How is screening for prostate cancer done? The recommended prostate cancer screening test is a blood test called the prostate-specific antigen (PSA)  test. PSA is a protein that is made in the prostate. As you age, your prostate naturally produces more PSA. Abnormally high PSA levels may be caused by: Prostate cancer. An enlarged prostate that is not caused by cancer (benign prostatic hyperplasia, or BPH). This condition is very common in older men. A prostate gland infection (prostatitis) or urinary tract infection. Certain medicines such as male hormones (like testosterone) or other medicines that raise testosterone levels. A rectal exam may be done as part of prostate cancer screening to help provide information about the size of your prostate gland. When a rectal exam is performed, it should be done after the PSA level is drawn to avoid any effect on the results. Depending on the PSA results, you may need more tests, such as: A physical exam to check the size of your prostate gland, if not done as part of screening. Blood and imaging tests. A procedure to remove tissue samples from your prostate gland for testing (biopsy). This is the only way to know for certain if you have prostate cancer. What are the benefits of prostate cancer screening? Screening can help to identify cancer at an early stage, before symptoms start and when the cancer can be treated more easily. There is a small chance that screening may lower your risk of dying from prostate cancer. The chance is small because prostate cancer is a slow-growing cancer, and most men with prostate cancer die from a different cause. What are the risks of prostate cancer screening? The main  risk of prostate cancer screening is diagnosing and treating prostate cancer that would never have caused any symptoms or problems. This is called overdiagnosisand overtreatment. PSA screening cannot tell you if your PSA is high due to cancer or a different cause. A prostate biopsy is the only procedure to diagnose prostate cancer. Even the results of a biopsy may not tell you if your cancer needs to be  treated. Slow-growing prostate cancer may not need any treatment other than monitoring, so diagnosing and treating it may cause unnecessary stress or other side effects. Questions to ask your health care provider When should I start prostate cancer screening? What is my risk for prostate cancer? How often do I need screening? What type of screening tests do I need? How do I get my test results? What do my results mean? Do I need treatment? Where to find more information The American Cancer Society: www.cancer.org American Urological Association: www.auanet.org Contact a health care provider if: You have difficulty urinating. You have pain when you urinate or ejaculate. You have blood in your urine or semen. You have pain in your back or in the area of your prostate. Summary Prostate cancer is a common type of cancer in men. The prostate gland is located below the bladder and in front of the rectum. This gland adds fluid to semen during ejaculation. Prostate cancer screening may identify cancer at an early stage, when the cancer can be treated more easily and is less likely to have spread to other areas of the body. The prostate-specific antigen (PSA) test is the recommended screening test for prostate cancer, but it has associated risks. Discuss the risks and benefits of prostate cancer screening with your health care provider. If you are age 14 or older, the risks that screening can cause are greater than the benefits that it may provide. This information is not intended to replace advice given to you by your health care provider. Make sure you discuss any questions you have with your health care provider. Document Revised: 12/23/2020 Document Reviewed: 12/23/2020 Elsevier Patient Education  2022 [place].  Benign Prostatic Hyperplasia Benign prostatic hyperplasia (BPH) is an enlarged prostate gland that is caused by the normal aging process. The prostate may get bigger as a man gets  older. The condition is not caused by cancer. The prostate is a walnut-sized gland that is involved in the production of semen. It is located in front of the rectum and below the bladder. The bladder stores urine. The urethra carries stored urine out of the body. An enlarged prostate can press on the urethra. This can make it harder to pass urine. The buildup of urine in the bladder can cause infection. Back pressure and infection may progress to bladder damage and kidney (renal) failure. What are the causes? This condition is part of the normal aging process. However, not all men develop problems from this condition. If the prostate enlarges away from the urethra, urine flow will not be blocked. If it enlarges toward the urethra and compresses it, there will be problems passing urine. What increases the risk? This condition is more likely to develop in men older than 50 years. What are the signs or symptoms? Symptoms of this condition include: Getting up often during the night to urinate. Needing to urinate frequently during the day. Difficulty starting urine flow. Decrease in size and strength of your urine stream. Leaking (dribbling) after urinating. Inability to pass urine. This needs immediate treatment. Inability to completely empty your  bladder. Pain when you pass urine. This is more common if there is also an infection. Urinary tract infection (UTI). How is this diagnosed? This condition is diagnosed based on your medical history, a physical exam, and your symptoms. Tests will also be done, such as: A post-void bladder scan. This measures any amount of urine that may remain in your bladder after you finish urinating. A digital rectal exam. In a rectal exam, your health care provider checks your prostate by putting a lubricated, gloved finger into your rectum to feel the back of your prostate gland. This exam detects the size of your gland and any abnormal lumps or growths. An exam of  your urine (urinalysis). A prostate specific antigen (PSA) screening. This is a blood test used to screen for prostate cancer. An ultrasound. This test uses sound waves to electronically produce a picture of your prostate gland. Your health care provider may refer you to a specialist in kidney and prostate diseases (urologist). How is this treated? Once symptoms begin, your health care provider will monitor your condition (active surveillance or watchful waiting). Treatment for this condition will depend on the severity of your condition. Treatment may include: Observation and yearly exams. This may be the only treatment needed if your condition and symptoms are mild. Medicines to relieve your symptoms, including: Medicines to shrink the prostate. Medicines to relax the muscle of the prostate. Surgery in severe cases. Surgery may include: Prostatectomy. In this procedure, the prostate tissue is removed completely through an open incision or with a laparoscope or robotics. Transurethral resection of the prostate (TURP). In this procedure, a tool is inserted through the opening at the tip of the penis (urethra). It is used to cut away tissue of the inner core of the prostate. The pieces are removed through the same opening of the penis. This removes the blockage. Transurethral incision (TUIP). In this procedure, small cuts are made in the prostate. This lessens the prostate's pressure on the urethra. Transurethral microwave thermotherapy (TUMT). This procedure uses microwaves to create heat. The heat destroys and removes a small amount of prostate tissue. Transurethral needle ablation (TUNA). This procedure uses radio frequencies to destroy and remove a small amount of prostate tissue. Interstitial laser coagulation ([place]). This procedure uses a laser to destroy and remove a small amount of prostate tissue. Transurethral electrovaporization (TUVP). This procedure uses electrodes to destroy and remove a  small amount of prostate tissue. Prostatic urethral lift. This procedure inserts an implant to push the lobes of the prostate away from the urethra. Follow these instructions at home: Take over-the-counter and prescription medicines only as told by your health care provider. Monitor your symptoms for any changes. Contact your health care provider with any changes. Avoid drinking large amounts of liquid before going to bed or out in public. Avoid or reduce how much caffeine or alcohol you drink. Give yourself time when you urinate. Keep all follow-up visits. This is important. Contact a health care provider if: You have unexplained back pain. Your symptoms do not get better with treatment. You develop side effects from the medicine you are taking. Your urine becomes very dark or has a bad smell. Your lower abdomen becomes distended and you have trouble passing urine. Get help right away if: You have a fever or chills. You suddenly cannot urinate. You feel light-headed or very dizzy, or you faint. There are large amounts of blood or clots in your urine. Your urinary problems become hard to manage. You develop  moderate to severe low back or flank pain. The flank is the side of your body between the ribs and the hip. These symptoms may be an emergency. Get help right away. Call 911. Do not wait to see if the symptoms will go away. Do not drive yourself to the hospital. Summary Benign prostatic hyperplasia (BPH) is an enlarged prostate that is caused by the normal aging process. It is not caused by cancer. An enlarged prostate can press on the urethra. This can make it hard to pass urine. This condition is more likely to develop in men older than 50 years. Get help right away if you suddenly cannot urinate. This information is not intended to replace advice given to you by your health care provider. Make sure you discuss any questions you have with your health care provider. Document  Revised: 01/15/2021 Document Reviewed: 01/15/2021 Elsevier Patient Education  [place].

## 2021-09-03 NOTE — Progress Notes (Signed)
° °  09/03/2021 8:36 AM   Christopher Parsons 03-03-1961 [id number]  Reason for visit: Follow up BPH, elevated PSA, history of microscopic hematuria  HPI: 61 year old male who previously has been followed by Dr. Junious Silk as well as Debroah Loop, PA for the above issues.  He has a history of a negative microscopic hematuria work-up in September 2021 with CT showing a 70 g prostate, and cystoscopy showing moderate lateral lobe hypertrophy, slightly elevated bladder neck, no median lobe, no bladder lesions.  I personally viewed and interpreted this CT images.  He also has a history of a mildly elevated PSA of 4.3 over the last year, that increased to 5.3 this year.  No reflex to free was sent.  He discontinued Flomax and Rapaflo as he felt these were causing insomnia.  He is not particularly bothered by his urinary symptoms aside from some mild frequency, and he limits fluid before bedtime.  He is not bothered enough to consider additional medications or outlet procedures at this time.  We reviewed his PSA trend, as well as his enlarged 70 g prostate on prior CT with a reassuring PSA density of 0.07.  DRE today shows a 70 g smooth prostate with no nodules or masses.  We discussed options for his mildly elevated PSA including repeat PSA in 3 months with reflex to free, 4K score, prostate biopsy, or prostate MRI.  He would like to start with a PSA with reflex to 3 in 3 months and will call with those results.  If increasing trend or worrisome percentage free would recommend biopsy or MRI.  RTC 27-month lab visit for PSA reflex to free, call with results Continue yearly follow-up regarding [place], MD  [facility] [address], [place], [place] [address] [phone]

## 2021-09-05 ENCOUNTER — Telehealth: Payer: Self-pay

## 2021-09-05 NOTE — Telephone Encounter (Signed)
Patient left a voicemail and wants to schedule a colonoscopy with Dr. Allen Norris. There is a referral from 01/14/21

## 2021-09-08 ENCOUNTER — Other Ambulatory Visit: Payer: Self-pay

## 2021-09-08 DIAGNOSIS — Z1211 Encounter for screening for malignant neoplasm of colon: Secondary | ICD-10-CM

## 2021-09-08 MED ORDER — NA SULFATE-K SULFATE-MG SULF 17.5-3.13-1.6 GM/177ML PO SOLN: 1.0000 | Freq: Once | ORAL | 0 refills | Status: AC

## 2021-09-08 NOTE — Progress Notes (Signed)
Gastroenterology Pre-Procedure Review  Request Date: 11/06/2021 Requesting Physician: Dr. Allen Norris  PATIENT REVIEW QUESTIONS: The patient responded to the following health history questions as indicated:    1. Are you having any GI issues? no 2. Do you have a personal history of Polyps? no 3. Do you have a family history of Colon Cancer or Polyps? no 4. Diabetes Mellitus? no 5. Joint replacements in the past 12 months?no 6. Major health problems in the past 3 months?no 7. Any artificial heart valves, MVP, or defibrillator?no Microvalve colaspe     MEDICATIONS & ALLERGIES:    Patient reports the following regarding taking any anticoagulation/antiplatelet therapy:   Plavix, Coumadin, Eliquis, Xarelto, Lovenox, Pradaxa, Brilinta, or Effient? no Aspirin? yes (81 mg)  Patient confirms/reports the following medications:  Current Outpatient Medications  Medication Sig Dispense Refill   aspirin 81 MG tablet Take 81 mg by mouth daily.     Multiple Vitamin (MULTIVITAMIN) tablet Take 1 tablet by mouth daily.     omeprazole (PRILOSEC) 40 MG capsule TAKE 1 CAPSULE BY MOUTH  DAILY 90 capsule 3   No current facility-administered medications for this visit.    Patient confirms/reports the following allergies:  No Known Allergies  No orders of the defined types were placed in this encounter.   AUTHORIZATION INFORMATION Primary Insurance: 1D#: Group #:  Secondary Insurance: 1D#: Group #:  SCHEDULE INFORMATION: Date: 11/06/2021 Time: Location:[facility]

## 2021-09-30 ENCOUNTER — Telehealth: Payer: Self-pay

## 2021-09-30 NOTE — Telephone Encounter (Signed)
Spoke with patient he understands he has been cleared to have procedure we received his clearance paper today giving the permission to go ?

## 2021-11-05 ENCOUNTER — Encounter: Payer: Self-pay | Admitting: Gastroenterology

## 2021-11-06 ENCOUNTER — Ambulatory Visit
Admission: RE | Admit: 2021-11-06 | Discharge: 2021-11-06 | Disposition: A | Discharge status: Home / Self Care | Payer: Managed Care, Other (non HMO) | Source: Ambulatory Visit | Attending: Gastroenterology | Admitting: Gastroenterology

## 2021-11-06 ENCOUNTER — Ambulatory Visit: Payer: Managed Care, Other (non HMO) | Admitting: Anesthesiology

## 2021-11-06 ENCOUNTER — Encounter
Admission: RE | Disposition: A | Discharge status: Home / Self Care | Payer: Self-pay | Source: Ambulatory Visit | Attending: Gastroenterology

## 2021-11-06 ENCOUNTER — Encounter: Payer: Self-pay | Admitting: Gastroenterology

## 2021-11-06 DIAGNOSIS — E039 Hypothyroidism, unspecified: Secondary | ICD-10-CM | POA: Diagnosis not present

## 2021-11-06 DIAGNOSIS — D123 Benign neoplasm of transverse colon: Secondary | ICD-10-CM | POA: Diagnosis not present

## 2021-11-06 DIAGNOSIS — K635 Polyp of colon: Secondary | ICD-10-CM

## 2021-11-06 DIAGNOSIS — I341 Nonrheumatic mitral (valve) prolapse: Secondary | ICD-10-CM | POA: Insufficient documentation

## 2021-11-06 DIAGNOSIS — Z1211 Encounter for screening for malignant neoplasm of colon: Secondary | ICD-10-CM | POA: Diagnosis present

## 2021-11-06 DIAGNOSIS — K219 Gastro-esophageal reflux disease without esophagitis: Secondary | ICD-10-CM | POA: Diagnosis not present

## 2021-11-06 DIAGNOSIS — G4733 Obstructive sleep apnea (adult) (pediatric): Secondary | ICD-10-CM | POA: Insufficient documentation

## 2021-11-06 DIAGNOSIS — K621 Rectal polyp: Secondary | ICD-10-CM | POA: Insufficient documentation

## 2021-11-06 DIAGNOSIS — K640 First degree hemorrhoids: Secondary | ICD-10-CM | POA: Insufficient documentation

## 2021-11-06 HISTORY — PX: COLONOSCOPY WITH PROPOFOL: SHX5780

## 2021-11-06 SURGERY — COLONOSCOPY WITH PROPOFOL
Anesthesia: General

## 2021-11-06 MED ORDER — FENTANYL CITRATE (PF) 100 MCG/2ML IJ SOLN
INTRAMUSCULAR | Status: AC
  Filled 2021-11-06: qty 2

## 2021-11-06 MED ORDER — PROPOFOL 500 MG/50ML IV EMUL
INTRAVENOUS | Status: DC | PRN
  Administered 2021-11-06: 50 ug/kg/min via INTRAVENOUS

## 2021-11-06 MED ORDER — PROPOFOL 10 MG/ML IV BOLUS
INTRAVENOUS | Status: DC | PRN
  Administered 2021-11-06: 50 mg via INTRAVENOUS

## 2021-11-06 MED ORDER — FENTANYL CITRATE (PF) 100 MCG/2ML IJ SOLN
INTRAMUSCULAR | Status: DC | PRN
  Administered 2021-11-06: 25 ug via INTRAVENOUS
  Administered 2021-11-06: 50 ug via INTRAVENOUS
  Administered 2021-11-06: 25 ug via INTRAVENOUS

## 2021-11-06 MED ORDER — LIDOCAINE HCL (CARDIAC) PF 100 MG/5ML IV SOSY
PREFILLED_SYRINGE | INTRAVENOUS | Status: DC | PRN
  Administered 2021-11-06: 80 mg via INTRAVENOUS

## 2021-11-06 MED ORDER — MIDAZOLAM HCL 2 MG/2ML IJ SOLN
INTRAMUSCULAR | Status: DC | PRN
  Administered 2021-11-06: 2 mg via INTRAVENOUS

## 2021-11-06 MED ORDER — SODIUM CHLORIDE 0.9 % IV SOLN: INTRAVENOUS | Status: DC

## 2021-11-06 MED ORDER — MIDAZOLAM HCL 2 MG/2ML IJ SOLN
INTRAMUSCULAR | Status: AC
  Filled 2021-11-06: qty 2

## 2021-11-06 NOTE — H&P (Signed)
? ?[name], MD [facility] ?[address] ?[place], [place] [address] ?Phone: [phone] ?Fax : [phone] ? ?Primary Care Physician:  Jerrol Banana., MD ?Primary Gastroenterologist:  Dr. Allen Norris ? ?Pre-Procedure History & Physical: ?HPI:  Christopher Parsons is a 61 y.o. male is here for a screening colonoscopy.  ? ?Past Medical History:  ?Diagnosis Date  ? GERD (gastroesophageal reflux disease)   ? Hypothyroidism   ? Mitral valve prolapse   ? echo 12/14 on paper chart  ? OSA on CPAP   ? ? ?Past Surgical History:  ?Procedure Laterality Date  ? COLONOSCOPY    ? ESOPHAGEAL DILATION N/A 01/27/2018  ? Procedure: ESOPHAGEAL DILATION;  Surgeon: [name], MD;  Location: [facility];  Service: Endoscopy;  Laterality: N/A;  ? ESOPHAGEAL DILATION  02/28/2021  ? Procedure: ESOPHAGEAL DILATION;  Surgeon: [name], MD;  Location: [facility];  Service: Endoscopy;;  ? ESOPHAGOGASTRODUODENOSCOPY    ? ESOPHAGOGASTRODUODENOSCOPY (EGD) WITH PROPOFOL N/A 01/18/2015  ? Procedure: ESOPHAGOGASTRODUODENOSCOPY (EGD) WITH PROPOFOL with dialtion;  Surgeon: [name], MD;  Location: [facility];  Service: Endoscopy;  Laterality: N/A;  ? ESOPHAGOGASTRODUODENOSCOPY (EGD) WITH PROPOFOL N/A 05/17/2015  ? Procedure: ESOPHAGOGASTRODUODENOSCOPY (EGD) WITH PROPOFOL, WITH DIALATION;  Surgeon: [name], MD;  Location: [facility];  Service: Endoscopy;  Laterality: N/A;  ? ESOPHAGOGASTRODUODENOSCOPY (EGD) WITH PROPOFOL N/A 12/10/2017  ? Procedure: ESOPHAGOGASTRODUODENOSCOPY (EGD) WITH PROPOFOL;  Surgeon: [name], MD;  Location: [facility];  Service: Endoscopy;  Laterality: N/A;  ? ESOPHAGOGASTRODUODENOSCOPY (EGD) WITH PROPOFOL N/A 01/27/2018  ? Procedure: ESOPHAGOGASTRODUODENOSCOPY (EGD) WITH PROPOFOL;  Surgeon: [name], MD;  Location: [facility];  Service: Endoscopy;  Laterality: N/A;  ? ESOPHAGOGASTRODUODENOSCOPY (EGD) WITH PROPOFOL N/A 02/28/2021  ? Procedure:  ESOPHAGOGASTRODUODENOSCOPY (EGD) WITH PROPOFOL;  Surgeon: [name], MD;  Location: [facility];  Service: Endoscopy;  Laterality: N/A;  ? GANGLION CYST EXCISION Left   ? wrist  ? TONSILLECTOMY    ? VASECTOMY    ? ? ?Prior to Admission medications   ?Medication Sig Start Date End Date Taking? Authorizing Provider  ?aspirin 81 MG tablet Take 81 mg by mouth daily.    [provider]  ?Multiple Vitamin (MULTIVITAMIN) tablet Take 1 tablet by mouth daily.    [provider]  ?omeprazole (PRILOSEC) 40 MG capsule TAKE 1 CAPSULE BY MOUTH  DAILY 01/20/20   Jerrol Banana., MD  ? ? ?Allergies as of 09/08/2021  ? (No Known Allergies)  ? ? ?Family History  ?Problem Relation Age of Onset  ? Hyperlipidemia Mother   ? Cancer Father 52  ?     lung cancer  ? Kidney cancer Father   ?     Underwent nephrectomy  ? Throat cancer Father   ? Healthy Sister   ? Healthy Brother   ? Healthy Brother   ? ? ?Social History  ? ?Socioeconomic History  ? Marital status: Married  ?  Spouse name: Not on file  ? Number of children: 3  ? Years of education: 10  ? Highest education level: Not on file  ?Occupational History  ? Not on file  ?Tobacco Use  ? Smoking status: Never  ? Smokeless tobacco: Never  ?Vaping Use  ? Vaping Use: Never used  ?Substance and Sexual Activity  ? Alcohol use: Yes  ?  Alcohol/week: 5.0 standard drinks  ?  Types: 5 Shots of liquor per week  ?  Comment: 1-2 drinks daily  ?  Drug use: No  ? Sexual activity: Yes  ?  Birth control/protection: Surgical  ?  Comment: vasectomy  ?Other Topics Concern  ? Not on file  ?Social History Narrative  ? Not on file  ? ?Social Determinants of Health  ? ?Financial Resource Strain: Not on file  ?Food Insecurity: Not on file  ?Transportation Needs: Not on file  ?Physical Activity: Not on file  ?Stress: Not on file  ?Social Connections: Not on file  ?Intimate Partner Violence: Not on file  ? ? ?Review of Systems: ?See HPI, otherwise negative ROS ? ?Physical  Exam: ?BP 135/86   Pulse 63   Temp (!) 97 ?F (36.1 ?C) (Temporal)   Resp 18   Ht '5\' 10"'$  (1.778 m)   Wt 95.3 kg   SpO2 100%   BMI 30.13 kg/m?  ?General:   Alert,  pleasant and cooperative in NAD ?Head:  Normocephalic and atraumatic. ?Neck:  Supple; no masses or thyromegaly. ?Lungs:  Clear throughout to auscultation.    ?Heart:  Regular rate and rhythm. ?Abdomen:  Soft, nontender and nondistended. Normal bowel sounds, without guarding, and without rebound.   ?Neurologic:  Alert and  oriented x4;  grossly normal neurologically. ? ?Impression/Plan: ?Christopher Parsons is now here to undergo a screening colonoscopy. ? ?Risks, benefits, and alternatives regarding colonoscopy have been reviewed with the patient.  Questions have been answered.  All parties agreeable. ?

## 2021-11-06 NOTE — Transfer of Care (Signed)
Immediate Anesthesia Transfer of Care Note ? ?Patient: Christopher Parsons ? ?Procedure(s) Performed: COLONOSCOPY WITH PROPOFOL ? ?Patient Location: PACU ? ?Anesthesia Type:General ? ?Level of Consciousness: sedated ? ?Airway & Oxygen Therapy: Patient Spontanous Breathing and Patient connected to nasal cannula oxygen ? ?Post-op Assessment: Report given to RN and Post -op Vital signs reviewed and stable ? ?Post vital signs: Reviewed and stable ? ?Last Vitals:  ?Vitals Value Taken Time  ?BP 142/127 11/06/21 0832  ?Temp 35.7 ?C 11/06/21 0829  ?Pulse 53 11/06/21 0833  ?Resp 16 11/06/21 0833  ?SpO2 100 % 11/06/21 0833  ?Vitals shown include unvalidated device data. ? ?Last Pain:  ?Vitals:  ? 11/06/21 0829  ?TempSrc: Temporal  ?PainSc: Asleep  ?   ? ?  ? ?Complications: No notable events documented. ?

## 2021-11-06 NOTE — Anesthesia Postprocedure Evaluation (Signed)
Anesthesia Post Note ? ?Patient: Christopher Parsons ? ?Procedure(s) Performed: COLONOSCOPY WITH PROPOFOL ? ?Patient location during evaluation: PACU ?Anesthesia Type: General ?Level of consciousness: awake and oriented ?Pain management: pain level controlled ?Vital Signs Assessment: post-procedure vital signs reviewed and stable ?Respiratory status: spontaneous breathing and respiratory function stable ?Cardiovascular status: stable ?Anesthetic complications: no ? ? ?No notable events documented. ? ? ?Last Vitals:  ?Vitals:  ? 11/06/21 0829 11/06/21 0832  ?BP:  101/63  ?Pulse: (!) 56 (!) 52  ?Resp: (!) 9 13  ?Temp: (!) 35.7 ?C   ?SpO2: 100% 99%  ?  ?Last Pain:  ?Vitals:  ? 11/06/21 0829  ?TempSrc: Temporal  ?PainSc: Asleep  ? ? ?  ?  ?  ?  ?  ?  ? ?VAN STAVEREN,Jalaya Sarver ? ? ? ? ?

## 2021-11-06 NOTE — Anesthesia Preprocedure Evaluation (Signed)
Anesthesia Evaluation  ?Patient identified by MRN, date of birth, ID band ?Patient awake ? ? ? ?Reviewed: ?Allergy & Precautions, NPO status , Patient's Chart, lab work & pertinent test results ? ?Airway ?Mallampati: III ? ?TM Distance: >3 FB ?Neck ROM: full ? ? ? Dental ? ?(+) Chipped ?  ?Pulmonary ?neg pulmonary ROS, sleep apnea and Continuous Positive Airway Pressure Ventilation ,  ?  ?Pulmonary exam normal ? ? ? ? ? ? ? Cardiovascular ?Exercise Tolerance: Good ?negative cardio ROS ?Normal cardiovascular exam ? ? ?  ?Neuro/Psych ?negative neurological ROS ? negative psych ROS  ? GI/Hepatic ?negative GI ROS, Neg liver ROS, GERD  Medicated,  ?Endo/Other  ?negative endocrine ROSHypothyroidism  ? Renal/GU ?negative Renal ROS  ?negative genitourinary ?  ?Musculoskeletal ?negative musculoskeletal ROS ?(+)  ? Abdominal ?  ?Peds ?negative pediatric ROS ?(+)  Hematology ?negative hematology ROS ?(+)   ?Anesthesia Other Findings ?Past Medical History: ?No date: GERD (gastroesophageal reflux disease) ?No date: Hypothyroidism ?No date: Mitral valve prolapse ?    Comment:  echo 12/14 on paper chart ?No date: OSA on CPAP ? ?Past Surgical History: ?No date: COLONOSCOPY ?01/27/2018: ESOPHAGEAL DILATION; N/A ?    Comment:  Procedure: ESOPHAGEAL DILATION;  Surgeon: [name],  ?             MD;  Location: [place];  Service: Endoscopy;  ?             Laterality: N/A; ?02/28/2021: ESOPHAGEAL DILATION ?    Comment:  Procedure: ESOPHAGEAL DILATION;  Surgeon: [name],  ?             MD;  Location: [place];  Service: Endoscopy;; ?No date: ESOPHAGOGASTRODUODENOSCOPY ?01/18/2015: ESOPHAGOGASTRODUODENOSCOPY (EGD) WITH PROPOFOL; N/A ?    Comment:  Procedure: ESOPHAGOGASTRODUODENOSCOPY (EGD) WITH  ?             PROPOFOL with dialtion;  Surgeon: [name], MD;   ?             Location: [place];  Service: Endoscopy;   ?             Laterality: N/A; ?05/17/2015:  ESOPHAGOGASTRODUODENOSCOPY (EGD) WITH PROPOFOL; N/A ?    Comment:  Procedure: ESOPHAGOGASTRODUODENOSCOPY (EGD) WITH  ?             PROPOFOL, WITH DIALATION;  Surgeon: [name], MD;   ?             Location: [place];  Service: Endoscopy;   ?             Laterality: N/A; ?12/10/2017: ESOPHAGOGASTRODUODENOSCOPY (EGD) WITH PROPOFOL; N/A ?    Comment:  Procedure: ESOPHAGOGASTRODUODENOSCOPY (EGD) WITH  ?             PROPOFOL;  Surgeon: [name], MD;  Location: [facility]  ?             [facility];  Service: Endoscopy;  Laterality: N/A; ?01/27/2018: ESOPHAGOGASTRODUODENOSCOPY (EGD) WITH PROPOFOL; N/A ?    Comment:  Procedure: ESOPHAGOGASTRODUODENOSCOPY (EGD) WITH  ?             PROPOFOL;  Surgeon: [name], MD;  Location: [facility];  Service: Endoscopy;  Laterality: N/A; ?02/28/2021: ESOPHAGOGASTRODUODENOSCOPY (EGD) WITH PROPOFOL; N/A ?    Comment:  Procedure: ESOPHAGOGASTRODUODENOSCOPY (EGD) WITH  ?  PROPOFOL;  Surgeon: [name], MD;  Location: [facility]  [facility];  Service: Endoscopy;  Laterality: N/A; ?No date: GANGLION CYST EXCISION; Left ?    Comment:  wrist ?No date: TONSILLECTOMY ?No date: VASECTOMY ? ?BMI   ? Body Mass Index: 30.13 kg/m?  ?  ? ? Reproductive/Obstetrics ?negative OB ROS ? ?  ? ? ? ? ? ? ? ? ? ? ? ? ? ?  ?  ? ? ? ? ? ? ? ? ?Anesthesia Physical ?Anesthesia Plan ? ?ASA: 2 ? ?Anesthesia Plan: General  ? ?Post-op Pain Management:   ? ?Induction: Intravenous ? ?PONV Risk Score and Plan: Propofol infusion and TIVA ? ?Airway Management Planned: Natural Airway and Nasal Cannula ? ?Additional Equipment:  ? ?Intra-op Plan:  ? ?Post-operative Plan:  ? ?Informed Consent: I have reviewed the patients History and Physical, chart, labs and discussed the procedure including the risks, benefits and alternatives for the proposed anesthesia with the patient or authorized representative who has indicated his/her understanding and acceptance.   ? ? ? ?Dental Advisory Given ? ?Plan Discussed with: CRNA and Surgeon ? ?Anesthesia Plan Comments:   ? ? ? ? ? ? ?Anesthesia Quick Evaluation ? ?

## 2021-11-06 NOTE — Op Note (Signed)
[facility] ?Gastroenterology ?Patient Name: Christopher Parsons ?Procedure Date: 11/06/2021 8:00 AM ?MRN: [id number] ?Account #: [id number] ?Date of Birth: 1961/06/20 ?Admit Type: Outpatient ?Age: 61 ?Room: [facility] ENDO ROOM 4 ?Gender: Male ?Note Status: Finalized ?Instrument Name: Colonoscope [id number] ?Procedure:             Colonoscopy ?Indications:           Screening for colorectal malignant neoplasm ?Providers:             [name] MD, MD ?Referring MD:          Janine Ores. Rosanna Randy, MD (Referring MD) ?Medicines:             Propofol per Anesthesia ?Complications:         No immediate complications. ?Procedure:             Pre-Anesthesia Assessment: ?                       - Prior to the procedure, a History and Physical was  ?                       performed, and patient medications and allergies were  ?                       reviewed. The patient's tolerance of previous  ?                       anesthesia was also reviewed. The risks and benefits  ?                       of the procedure and the sedation options and risks  ?                       were discussed with the patient. All questions were  ?                       answered, and informed consent was obtained. Prior  ?                       Anticoagulants: The patient has taken no previous  ?                       anticoagulant or antiplatelet agents. ASA Grade  ?                       Assessment: II - A patient with mild systemic disease.  ?                       After reviewing the risks and benefits, the patient  ?                       was deemed in satisfactory condition to undergo the  ?                       procedure. ?                       After obtaining informed consent, the colonoscope was  ?  passed under direct vision. Throughout the procedure,  ?                       the patient's blood pressure, pulse, and oxygen  ?                       saturations were monitored continuously. The  ?                        Colonoscope was introduced through the anus and  ?                       advanced to the the cecum, identified by appendiceal  ?                       orifice and ileocecal valve. The colonoscopy was  ?                       performed without difficulty. The patient tolerated  ?                       the procedure well. The quality of the bowel  ?                       preparation was excellent. ?Findings: ?     The perianal and digital rectal examinations were normal. ?     A 4 mm polyp was found in the rectum. The polyp was sessile. The polyp  ?     was removed with a cold snare. Resection and retrieval were complete. ?     Two sessile polyps were found in the transverse colon. The polyps were 2  ?     to 4 mm in size. These polyps were removed with a cold snare. Resection  ?     and retrieval were complete. ?     Non-bleeding internal hemorrhoids were found during retroflexion. The  ?     hemorrhoids were Grade I (internal hemorrhoids that do not prolapse). ?Impression:            - One 4 mm polyp in the rectum, removed with a cold  ?                       snare. Resected and retrieved. ?                       - Two 2 to 4 mm polyps in the transverse colon,  ?                       removed with a cold snare. Resected and retrieved. ?                       - Non-bleeding internal hemorrhoids. ?Recommendation:        - Discharge patient to home. ?                       - Resume previous diet. ?                       - Continue present medications. ?                       -  Await pathology results. ?                       - If the pathology report reveals adenomatous tissue,  ?                       then repeat the colonoscopy for surveillance in 7  ?                       years otherwise 10 years. ?Procedure Code(s):     --- Professional --- ?                       [id number], Colonoscopy, flexible; with removal of  ?                       tumor(s), polyp(s), or other lesion(s) by snare  ?                        technique ?Diagnosis Code(s):     --- Professional --- ?                       Z12.11, Encounter for screening for malignant neoplasm  ?                       of colon ?                       K62.1, Rectal polyp ?CPT copyright 2019 American Medical Association. All rights reserved. ?The codes documented in this report are preliminary and upon coder review may  ?be revised to meet current compliance requirements. ?[name] MD, MD ?11/06/2021 8:29:08 AM ?This report has been signed electronically. ?Number of Addenda: 0 ?Note Initiated On: 11/06/2021 8:00 AM ?Scope Withdrawal Time: 0 hours 13 minutes 29 seconds  ?Total Procedure Duration: 0 hours 15 minutes 30 seconds  ?Estimated Blood Loss:  Estimated blood loss: none. ?     [facility] ?

## 2021-11-07 ENCOUNTER — Encounter: Payer: Self-pay | Admitting: Gastroenterology

## 2021-11-07 LAB — SURGICAL PATHOLOGY

## 2021-11-10 ENCOUNTER — Encounter: Payer: Self-pay | Admitting: Gastroenterology

## 2021-12-02 ENCOUNTER — Other Ambulatory Visit: Payer: Managed Care, Other (non HMO)

## 2021-12-02 DIAGNOSIS — N401 Enlarged prostate with lower urinary tract symptoms: Secondary | ICD-10-CM

## 2021-12-03 LAB — PSA TOTAL (REFLEX TO FREE): Prostate Specific Ag, Serum: 5.9 ng/mL — ABNORMAL HIGH (ref 0.0–4.0)

## 2021-12-03 LAB — FPSA% REFLEX
% FREE PSA: 13.2 %
PSA, FREE: 0.78 ng/mL

## 2021-12-11 ENCOUNTER — Telehealth: Payer: Self-pay

## 2021-12-11 NOTE — Telephone Encounter (Signed)
-----   Message from Billey Co, MD sent at 12/09/2021  7:54 AM EDT ----- PSA increased slightly to 5.9.  Repeat PSA in 6 months, prostate MRI, or biopsy all reasonable.  If he is unsure how he would like to proceed, okay to schedule follow-up to discuss options again in person  Nickolas Madrid, MD 12/09/2021

## 2021-12-11 NOTE — Telephone Encounter (Signed)
Called pt, no answer. Left detailed message per DPR. Advised pt to call back with how he would like to proceed. Mychart message sent.

## 2021-12-12 ENCOUNTER — Other Ambulatory Visit: Payer: Self-pay

## 2021-12-12 DIAGNOSIS — R972 Elevated prostate specific antigen [PSA]: Secondary | ICD-10-CM

## 2021-12-12 NOTE — Telephone Encounter (Signed)
Please schedule prostate MRI for elevated PSA, with call with results  Nickolas Madrid, MD 12/12/2021

## 2021-12-14 IMAGING — CT CT CHEST W/O CM
2 of 4 series · 15 of 36 positions shown, 18 images · non-contrast
Comparison: 02/16/2020

CLINICAL DATA: Pulmonary nodule

EXAM:
CT CHEST WITHOUT CONTRAST
TECHNIQUE: Multidetector CT imaging of the chest was performed following the
standard protocol without IV contrast.

[Series 2: chest 2.00 · axial · 0.69mm/px · z∈[-1188,-902]mm · 12 of 169 slices shown, 15 images]
[im 13/169  mediastinal]
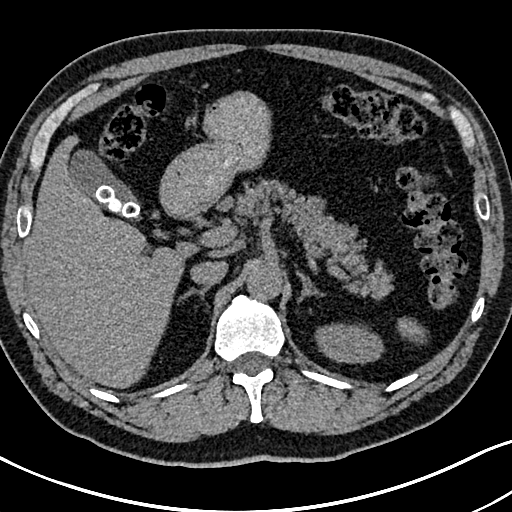
[im 13/169  lung]
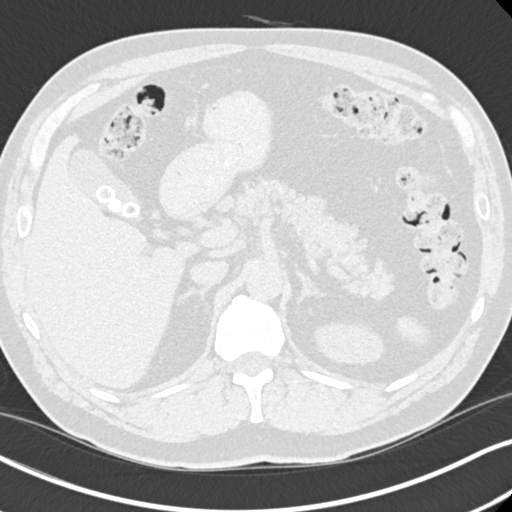
[im 26/169  lung]
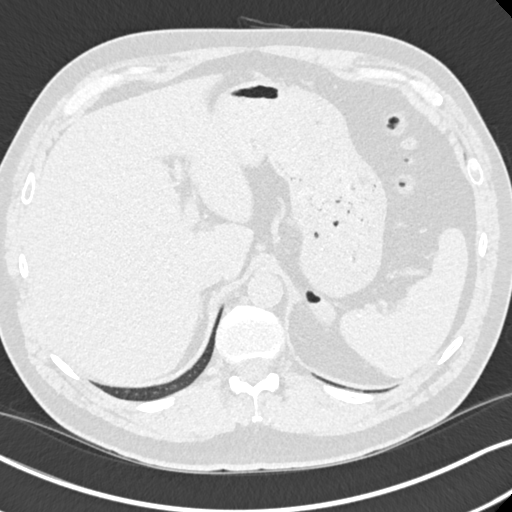
[im 39/169  lung]
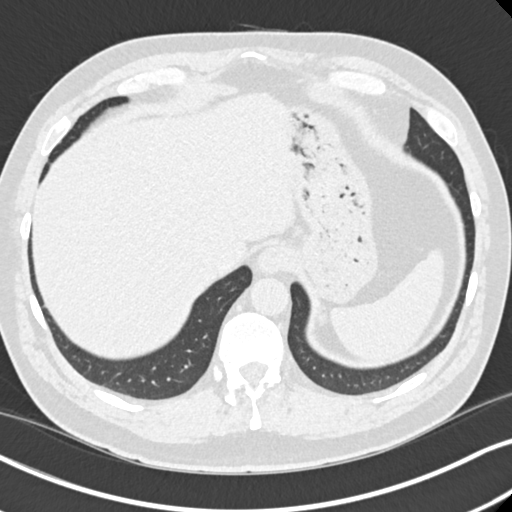
[im 52/169  lung]
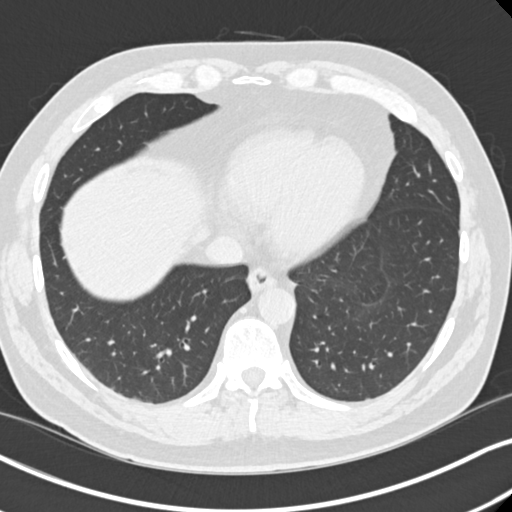
[im 65/169  mediastinal]
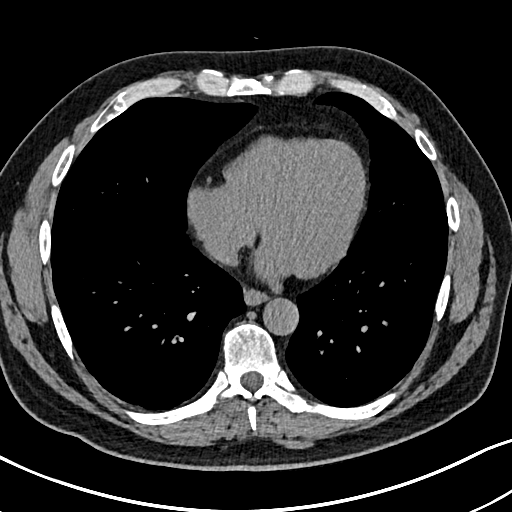
[im 65/169  lung]
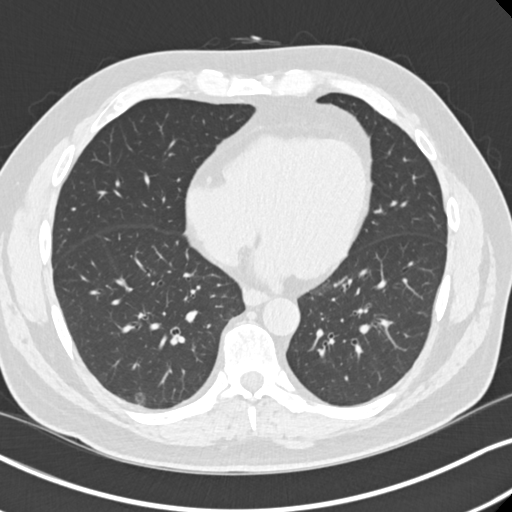
[im 78/169  lung]
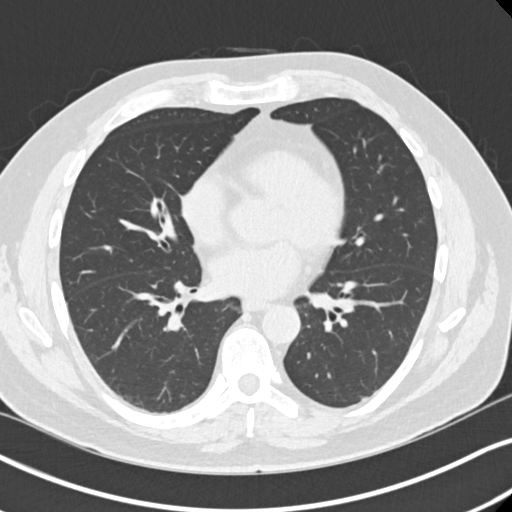
[im 91/169  lung]
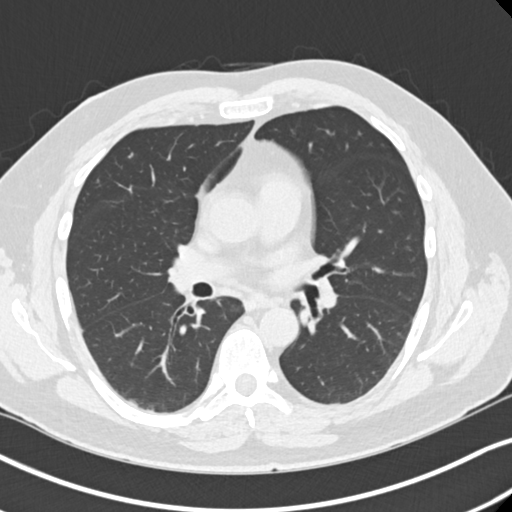
[im 104/169  lung]
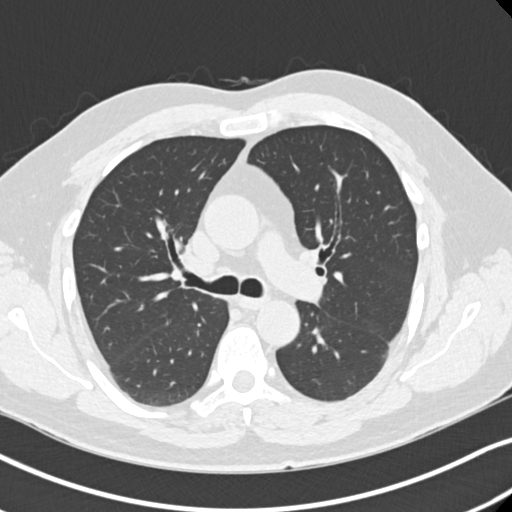
[im 117/169  mediastinal]
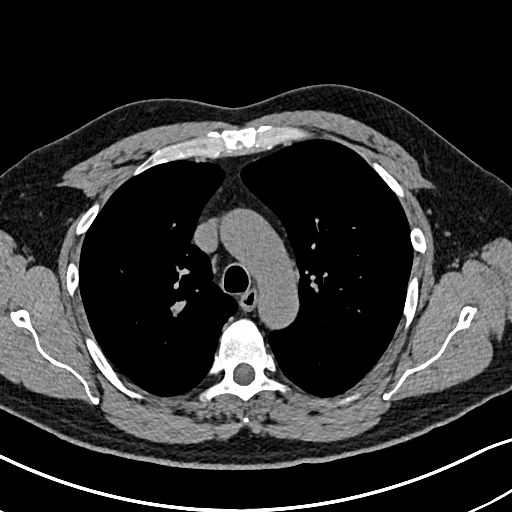
[im 117/169  lung]
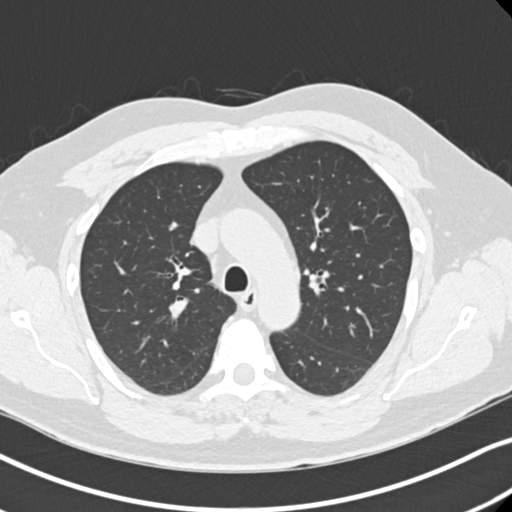
[im 130/169  lung]
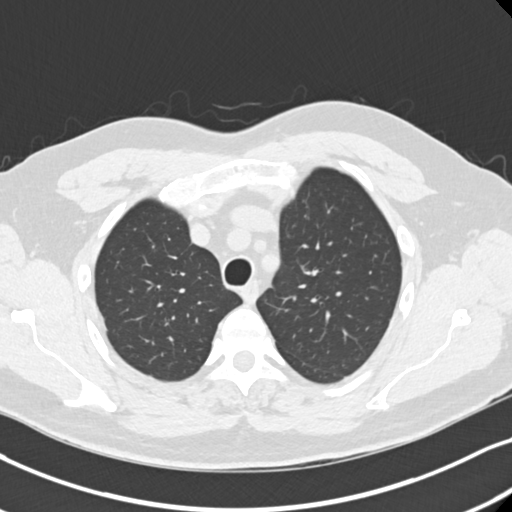
[im 143/169  lung]
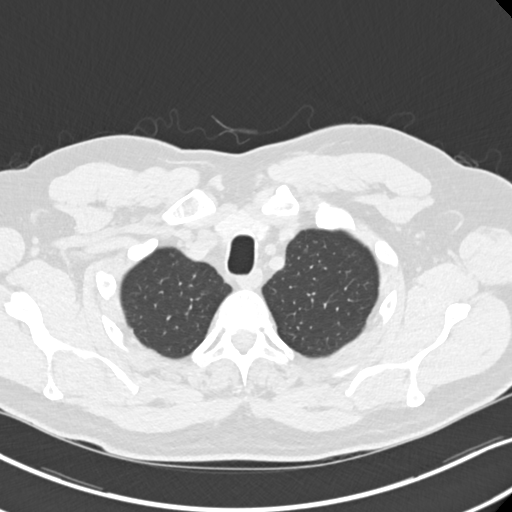
[im 156/169  lung]
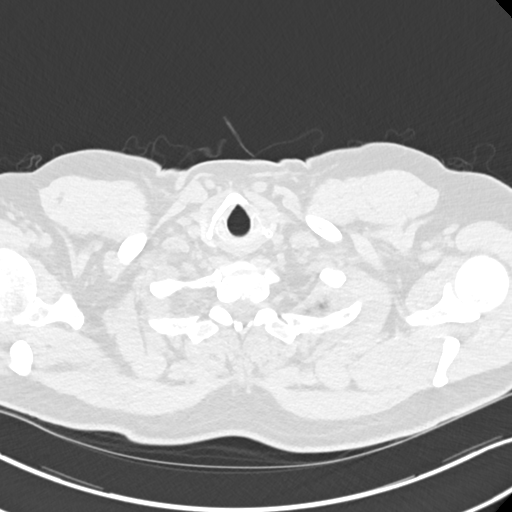

[Series 5: coronals chest 2.00 cor · coronal · 0.66mm/px · 3 of 154 slices shown]
[im 31/154  lung]
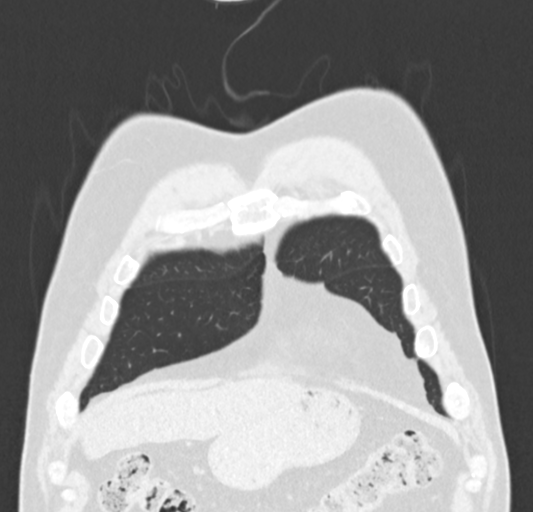
[im 62/154  lung]
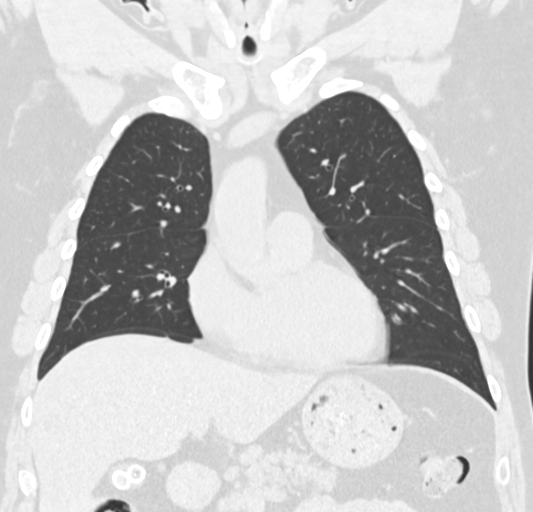
[im 92/154  lung]
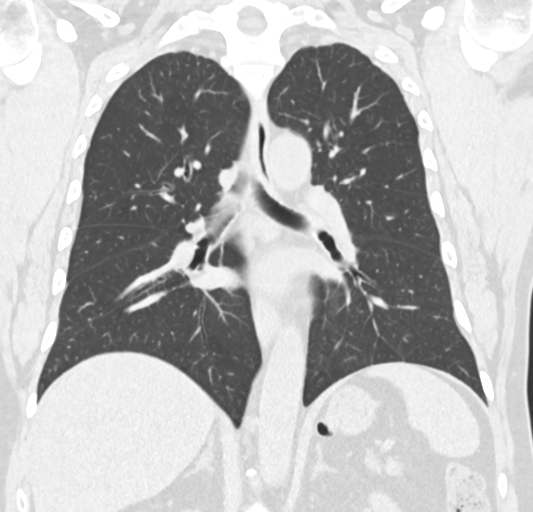

[15 of 36 positions shown; findings below may reference images not displayed]

FINDINGS: Cardiovascular: Unenhanced imaging of the heart and great vessels
demonstrates no pericardial effusion. Normal caliber of the thoracic
aorta.

Mediastinum/Nodes: No enlarged mediastinal or axillary lymph nodes.
Thyroid gland, trachea, and esophagus demonstrate no significant
findings.

Lungs/Pleura: No acute airspace disease, effusion, or pneumothorax.
The central airways are widely patent.

There is a 7 x 6 mm ground-glass nodule within the right lower lobe
image 106/3, unchanged since prior exam. No other pulmonary nodules
or masses.

Upper Abdomen: Calcified gallstones are identified without
cholecystitis. Small gastric diverticulum off the fundus
posteriorly. Partial visualization of a right renal cyst.

Musculoskeletal: No acute or destructive bony lesions. Reconstructed
images demonstrate no additional findings.
IMPRESSION: 1. Stable right lower lobe ground-glass nodule with a mean diameter
of 6 mm. Repeat CT is recommended every 2 years until 5 years of
stability has been established. This recommendation follows the
consensus statement: Guidelines for Management of Incidental
Pulmonary Nodules Detected on CT Images: From the [HOSPITAL]
2. No acute intrathoracic process.
3. Cholelithiasis without cholecystitis.

## 2022-01-05 ENCOUNTER — Ambulatory Visit
Admission: RE | Admit: 2022-01-05 | Discharge: 2022-01-05 | Disposition: A | Discharge status: Home / Self Care | Payer: Managed Care, Other (non HMO) | Source: Ambulatory Visit | Attending: Urology | Admitting: Urology

## 2022-01-05 DIAGNOSIS — R972 Elevated prostate specific antigen [PSA]: Secondary | ICD-10-CM | POA: Diagnosis present

## 2022-01-05 MED ORDER — GADOBUTROL 1 MMOL/ML IV SOLN
9.0000 mL | Freq: Once | INTRAVENOUS | Status: AC | PRN
  Administered 2022-01-05: 9 mL via INTRAVENOUS

## 2022-01-14 ENCOUNTER — Other Ambulatory Visit: Payer: Self-pay | Admitting: Family Medicine

## 2022-01-19 ENCOUNTER — Ambulatory Visit (INDEPENDENT_AMBULATORY_CARE_PROVIDER_SITE_OTHER): Payer: Managed Care, Other (non HMO) | Admitting: Family Medicine

## 2022-01-19 VITALS — BP 126/76 | HR 59 | Temp 98.1°F | Ht 70.0 in | Wt 211.0 lb

## 2022-01-19 DIAGNOSIS — R972 Elevated prostate specific antigen [PSA]: Secondary | ICD-10-CM

## 2022-01-19 DIAGNOSIS — G4733 Obstructive sleep apnea (adult) (pediatric): Secondary | ICD-10-CM

## 2022-01-19 DIAGNOSIS — Z9989 Dependence on other enabling machines and devices: Secondary | ICD-10-CM

## 2022-01-19 DIAGNOSIS — E039 Hypothyroidism, unspecified: Secondary | ICD-10-CM | POA: Diagnosis not present

## 2022-01-19 DIAGNOSIS — Z0000 Encounter for general adult medical examination without abnormal findings: Secondary | ICD-10-CM

## 2022-01-19 NOTE — Progress Notes (Signed)
Complete physical exam   Patient: Christopher Parsons   DOB: [birth year]   61 y.o. Male  MRN: [id number] Visit Date: 01/19/2022  Today's healthcare provider: Wilhemena Durie, MD   No chief complaint on file.  Subjective    Christopher Parsons is a 61 y.o. male who presents today for a complete physical exam.  He reports consuming a general diet. He walks 50 minutes per day 5 days per week He generally feels well. He reports sleeping well. He does have additional problems to discuss today.  He is planning to retire this fall in September. HPI  Patient has some abdominal pain that comes and goes.  He states it has been doing this for several months.  It is in the left upper quadrant just under the ribs and is very vague.  He is taking baby aspirin daily for several years now.  No nausea or vomiting.  Past Medical History:  Diagnosis Date   GERD (gastroesophageal reflux disease)    Hypothyroidism    Mitral valve prolapse    echo 12/14 on paper chart   OSA on CPAP    Past Surgical History:  Procedure Laterality Date   COLONOSCOPY     COLONOSCOPY WITH PROPOFOL N/A 11/06/2021   Procedure: COLONOSCOPY WITH PROPOFOL;  Surgeon: [name], MD;  Location: [facility] END[facility]COPY;  Service: Endoscopy;  Laterality: N/A;   ESOPHAGEAL DILATION N/A 01/27/2018   Procedure: ESOPHAGEAL DILATION;  Surgeon: [name], MD;  Location: [place];  Service: Endoscopy;  Laterality: N/A;   ESOPHAGEAL DILATION  02/28/2021   Procedure: ESOPHAGEAL DILATION;  Surgeon: [name], MD;  Location: [place];  Service: Endoscopy;;   ESOPHAGOGASTRODUODENOSCOPY     ESOPHAGOGASTRODUODENOSCOPY (EGD) WITH PROPOFOL N/A 01/18/2015   Procedure: ESOPHAGOGASTRODUODENOSCOPY (EGD) WITH PROPOFOL with dialtion;  Surgeon: [name], MD;  Location: [place];  Service: Endoscopy;  Laterality: N/A;   ESOPHAGOGASTRODUODENOSCOPY (EGD) WITH PROPOFOL N/A 05/17/2015   Procedure:  ESOPHAGOGASTRODUODENOSCOPY (EGD) WITH PROPOFOL, WITH DIALATION;  Surgeon: [name], MD;  Location: [place];  Service: Endoscopy;  Laterality: N/A;   ESOPHAGOGASTRODUODENOSCOPY (EGD) WITH PROPOFOL N/A 12/10/2017   Procedure: ESOPHAGOGASTRODUODENOSCOPY (EGD) WITH PROPOFOL;  Surgeon: [name], MD;  Location: [place];  Service: Endoscopy;  Laterality: N/A;   ESOPHAGOGASTRODUODENOSCOPY (EGD) WITH PROPOFOL N/A 01/27/2018   Procedure: ESOPHAGOGASTRODUODENOSCOPY (EGD) WITH PROPOFOL;  Surgeon: [name], MD;  Location: [place];  Service: Endoscopy;  Laterality: N/A;   ESOPHAGOGASTRODUODENOSCOPY (EGD) WITH PROPOFOL N/A 02/28/2021   Procedure: ESOPHAGOGASTRODUODENOSCOPY (EGD) WITH PROPOFOL;  Surgeon: [name], MD;  Location: [place];  Service: Endoscopy;  Laterality: N/A;   GANGLION CYST EXCISION Left    wrist   TONSILLECTOMY     VASECTOMY     Social History   Socioeconomic History   Marital status: Married    Spouse name: Not on file   Number of children: 3   Years of education: 12   Highest education level: Not on file  Occupational History   Not on file  Tobacco Use   Smoking status: Never   Smokeless tobacco: Never  Vaping Use   Vaping Use: Never used  Substance and Sexual Activity   Alcohol use: Yes    Alcohol/week: 5.0 standard drinks of alcohol    Types: 5 Shots of liquor per week    Comment: 1-2 drinks daily   Drug use: No   Sexual activity: Yes    Birth control/protection: Surgical  Comment: vasectomy  Other Topics Concern   Not on file  Social History Narrative   Not on file   Social Determinants of Health   Financial Resource Strain: Not on file  Food Insecurity: Not on file  Transportation Needs: Not on file  Physical Activity: Not on file  Stress: Not on file  Social Connections: Not on file  Intimate Partner Violence: Not on file   Family Status  Relation Name Status   Mother  Alive   Father   Deceased   Sister  Alive   Brother  Alive   Brother  Alive   Family History  Problem Relation Age of Onset   Hyperlipidemia Mother    Cancer Father 51       lung cancer   Kidney cancer Father        Underwent nephrectomy   Throat cancer Father    Healthy Sister    Healthy Brother    Healthy Brother    No Known Allergies  Patient Care Team: Jerrol Banana., MD as PCP - General (Family Medicine)   Medications: Outpatient Medications Prior to Visit  Medication Sig   aspirin 81 MG tablet Take 81 mg by mouth daily.   Multiple Vitamin (MULTIVITAMIN) tablet Take 1 tablet by mouth daily.   omeprazole (PRILOSEC) 40 MG capsule TAKE 1 CAPSULE BY MOUTH  DAILY   No facility-administered medications prior to visit.    Review of Systems  Constitutional: Negative.   HENT: Negative.    Eyes: Negative.   Respiratory: Negative.    Cardiovascular: Negative.   Gastrointestinal:  Positive for abdominal pain.  Endocrine: Negative.   Genitourinary:  Positive for difficulty urinating and genital sores.  Musculoskeletal: Negative.   Skin: Negative.   Allergic/Immunologic: Negative.   Neurological: Negative.   Hematological: Negative.   Psychiatric/Behavioral: Negative.      Last lipids Lab Results  Component Value Date   CHOL 225 (H) 01/20/2021   HDL 44 01/20/2021   LDLCALC 159 (H) 01/20/2021   TRIG 120 01/20/2021   CHOLHDL 5.1 (H) 01/20/2021      Objective     BP 126/76 (BP Location: Right Arm, Patient Position: Sitting, Cuff Size: Normal)   Pulse (!) 59   Temp 98.1 F (36.7 C) (Oral)   Ht '5\' 10"'$  (1.778 m)   Wt 211 lb (95.7 kg)   SpO2 98%   BMI 30.28 kg/m  BP Readings from Last 3 Encounters:  01/19/22 126/76  11/06/21 122/78  09/03/21 131/78   Wt Readings from Last 3 Encounters:  01/19/22 211 lb (95.7 kg)  11/06/21 210 lb (95.3 kg)  09/03/21 219 lb 1.6 oz (99.4 kg)       Physical Exam Constitutional:      Appearance: Normal appearance.  HENT:      Head: Normocephalic and atraumatic.     Right Ear: Tympanic membrane, ear canal and external ear normal.     Left Ear: Tympanic membrane, ear canal and external ear normal.     Nose: Nose normal.     Mouth/Throat:     Mouth: Mucous membranes are moist.     Pharynx: Oropharynx is clear.  Eyes:     Extraocular Movements: Extraocular movements intact.     Conjunctiva/sclera: Conjunctivae normal.     Pupils: Pupils are equal, round, and reactive to light.  Cardiovascular:     Rate and Rhythm: Normal rate and regular rhythm.     Pulses: Normal pulses.  Heart sounds: Normal heart sounds.  Pulmonary:     Effort: Pulmonary effort is normal.     Breath sounds: Normal breath sounds.  Abdominal:     General: Bowel sounds are normal.     Palpations: Abdomen is soft.  Genitourinary:    Penis: Normal.      Testes: Normal.  Musculoskeletal:     Cervical back: Normal range of motion and neck supple.  Skin:    General: Skin is warm and dry.  Neurological:     General: No focal deficit present.     Mental Status: He is alert and oriented to person, place, and time. Mental status is at baseline.  Psychiatric:        Mood and Affect: Mood normal.        Behavior: Behavior normal.        Thought Content: Thought content normal.        Judgment: Judgment normal.       Last depression screening scores    01/19/2022    8:56 AM 01/14/2021    9:45 AM 01/10/2020    9:36 AM  PHQ 2/9 Scores  PHQ - 2 Score 0 1 0  PHQ- 9 Score '1 2 2   '$ Last fall risk screening    01/19/2022    8:56 AM  Fall Risk   Falls in the past year? 0   Last Audit-C alcohol use screening    01/19/2022    8:56 AM  Alcohol Use Disorder Test (AUDIT)  1. How often do you have a drink containing alcohol? 4  2. How many drinks containing alcohol do you have on a typical day when you are drinking? 0  3. How often do you have six or more drinks on one occasion? 0  AUDIT-C Score 4   A score of 3 or more in women, and 4 or  more in men indicates increased risk for alcohol abuse, EXCEPT if all of the points are from question 1   No results found for any visits on 01/19/22.  Assessment & Plan    Routine Health Maintenance and Physical Exam  Exercise Activities and Dietary recommendations  Goals   None     Immunization History  Administered Date(s) Administered   Influenza Inj Mdck Quad Pf 06/11/2017   Influenza,inj,Quad PF,6+ Mos 07/04/2013, 03/05/2019   Influenza-Unspecified 06/12/2017, 03/05/2019, 04/02/2020   PFIZER(Purple Top)SARS-COV-2 Vaccination 09/23/2019, 10/17/2019   Tdap 06/23/2010   Zoster Recombinat (Shingrix) 05/16/2021    Health Maintenance  Topic Date Due   HIV Screening  Never done   COVID-19 Vaccine (3 - Pfizer series) 12/12/2019   TETANUS/TDAP  06/23/2020   Zoster Vaccines- Shingrix (2 of 2) 07/11/2021   INFLUENZA VACCINE  02/10/2022   COLONOSCOPY (Pts 45-50yr Insurance coverage will need to be confirmed)  11/06/2028   Hepatitis C Screening  Completed   HPV VACCINES  Aged Out    Discussed health benefits of physical activity, and encouraged him to engage in regular exercise appropriate for his age and condition.  1. Annual physical exam This time stop baby aspirin daily.  I will see him back in 3 to 5 months to see how his weight loss is going and to see how his abdominal discomfort is going.  This is a very vague left upper quadrant discomfort that hopefully will improve after stopping aspirin.  Diet and exercise with some weight loss is discussed - CBC with Differential/Platelet - Comprehensive metabolic panel - Lipid Panel With LDL/HDL  Ratio - TSH - SARS-CoV-2 Antibodies  2. Obstructive sleep apnea treated with continuous positive airway pressure (CPAP) Now on CPAP and doing well  3. Adult hypothyroidism   4. Elevated PSA Followed by urology.   No follow-ups on file.     I, Wilhemena Durie, MD, have reviewed all documentation for this visit. The  documentation on 01/19/22 for the exam, diagnosis, procedures, and orders are all accurate and complete.    Tamar Cranford Mon, MD  [facility] [phone] (phone) [phone] (fax)  [place]

## 2022-01-19 NOTE — Patient Instructions (Signed)
Stop Aspirin

## 2022-01-23 LAB — COMPREHENSIVE METABOLIC PANEL
ALT: 23 IU/L (ref 0–44)
AST: 20 IU/L (ref 0–40)
Albumin/Globulin Ratio: 1.5 (ref 1.2–2.2)
Albumin: 4.3 g/dL (ref 3.9–4.9)
Alkaline Phosphatase: 43 IU/L — ABNORMAL LOW (ref 44–121)
BUN/Creatinine Ratio: 17 (ref 10–24)
BUN: 17 mg/dL (ref 8–27)
Bilirubin Total: 0.4 mg/dL (ref 0.0–1.2)
CO2: 23 mmol/L (ref 20–29)
Calcium: 9.5 mg/dL (ref 8.6–10.2)
Chloride: 103 mmol/L (ref 96–106)
Creatinine, Ser: 1.03 mg/dL (ref 0.76–1.27)
Globulin, Total: 2.8 g/dL (ref 1.5–4.5)
Glucose: 94 mg/dL (ref 70–99)
Potassium: 4.6 mmol/L (ref 3.5–5.2)
Sodium: 141 mmol/L (ref 134–144)
Total Protein: 7.1 g/dL (ref 6.0–8.5)
eGFR: 83 mL/min/{1.73_m2} (ref >59)

## 2022-01-23 LAB — LIPID PANEL WITH LDL/HDL RATIO
Cholesterol, Total: 186 mg/dL (ref 100–199)
HDL: 38 mg/dL — ABNORMAL LOW (ref >39)
LDL Chol Calc (NIH): 129 mg/dL — ABNORMAL HIGH (ref 0–99)
LDL/HDL Ratio: 3.4 ratio (ref 0.0–3.6)
Triglycerides: 103 mg/dL (ref 0–149)
VLDL Cholesterol Cal: 19 mg/dL (ref 5–40)

## 2022-01-23 LAB — CBC WITH DIFFERENTIAL/PLATELET
Basophils Absolute: 0.1 10*3/uL (ref 0.0–0.2)
Basos: 1 %
EOS (ABSOLUTE): 0.2 10*3/uL (ref 0.0–0.4)
Eos: 3 %
Hematocrit: 43 % (ref 37.5–51.0)
Hemoglobin: 14.3 g/dL (ref 13.0–17.7)
Immature Grans (Abs): 0 10*3/uL (ref 0.0–0.1)
Immature Granulocytes: 0 %
Lymphocytes Absolute: 1.2 10*3/uL (ref 0.7–3.1)
Lymphs: 17 %
MCH: 29.9 pg (ref 26.6–33.0)
MCHC: 33.3 g/dL (ref 31.5–35.7)
MCV: 90 fL (ref 79–97)
Monocytes Absolute: 0.9 10*3/uL (ref 0.1–0.9)
Monocytes: 13 %
Neutrophils Absolute: 4.4 10*3/uL (ref 1.4–7.0)
Neutrophils: 66 %
Platelets: 302 10*3/uL (ref 150–450)
RBC: 4.78 x10E6/uL (ref 4.14–5.80)
RDW: 12.8 % (ref 11.6–15.4)
WBC: 6.7 10*3/uL (ref 3.4–10.8)

## 2022-01-23 LAB — TSH: TSH: 3.01 u[IU]/mL (ref 0.450–4.500)

## 2022-01-24 LAB — SARS-COV-2 ANTIBODIES: SARS-CoV-2 Antibodies: NEGATIVE

## 2022-02-25 ENCOUNTER — Encounter: Payer: Self-pay | Admitting: Family Medicine

## 2022-03-19 ENCOUNTER — Ambulatory Visit: Payer: Managed Care, Other (non HMO) | Admitting: Urology

## 2022-03-19 ENCOUNTER — Encounter: Payer: Self-pay | Admitting: Urology

## 2022-03-19 VITALS — BP 146/83 | HR 58 | Ht 70.0 in | Wt 207.0 lb

## 2022-03-19 DIAGNOSIS — N401 Enlarged prostate with lower urinary tract symptoms: Secondary | ICD-10-CM | POA: Diagnosis not present

## 2022-03-19 DIAGNOSIS — N138 Other obstructive and reflux uropathy: Secondary | ICD-10-CM | POA: Diagnosis not present

## 2022-03-19 DIAGNOSIS — R972 Elevated prostate specific antigen [PSA]: Secondary | ICD-10-CM

## 2022-03-19 NOTE — Progress Notes (Signed)
   03/19/2022 2:43 PM   [name] 1961-01-04 [id number]  Reason for visit: Follow up elevated PSA, discuss MRI results, lower urinary tract symptoms  HPI: Healthy 61 year old male with mildly elevated PSA that has slowly increased over the last 2 years.  Most recent PSA was 5.9(13% free) in May 2023.  Using shared decision making opted for a prostate MRI.  I personally viewed and interpreted the prostate MRI dated 01/05/2022 showing a 65 g prostate(PSA density 0.09), and a small PI-RADS 3 lesion.  We reviewed these results and indeterminate nature of PI-RADS 3 lesions.  With his reassuring PSA density I think it is reasonable to defer biopsy at this time, but alternative would be MRI fusion biopsy.  He opted to continue PSA monitoring at this time.  In terms of his urinary symptoms, he has some mild urinary frequency, but this is not bothersome enough to consider medications at this time.  RTC 9 months PSA prior  Billey Co, MD  [facility] [address] [place],  [address] [phone]

## 2022-03-19 NOTE — Patient Instructions (Signed)
The Benefits of a Plant-Based Diet for Urology Health  A plant-based diet emphasizes the consumption of whole, unprocessed plant foods while minimizing or excluding animal products including meat and dairy products. This dietary approach has gained attention for its potential to promote overall health, including urology-related conditions. Incorporating a plant-based diet into your lifestyle can offer numerous benefits for maintaining optimal urology health.  1. Reduced Risk of Kidney Stones: A plant-based diet is typically rich in fruits, vegetables, legumes, and whole grains. These foods are high in dietary fiber, potassium, and magnesium, which can help reduce the risk of developing kidney stones. Be careful to avoid high quantities of spinach, as these can contribute to kidney stone formation if eaten in large volumes. The increased intake of water-soluble fiber can enhance the excretion of waste products and prevent the crystallization of minerals that lead to stone formation.  2. Improved Prostate Health: Studies have suggested a link between the consumption of red and processed meats and an increased risk of prostate problems, including benign prostatic hyperplasia (BPH) and prostate cancer. By adopting a plant-based diet, you can lower your intake of saturated fats and decrease the risk of these conditions. PSA levels can often decrease on plant based diets! Plant foods are also rich in antioxidants and phytochemicals that have been associated with prostate health.  3. Better Bladder Function: A diet focused on plant-based foods can contribute to better bladder health by reducing the risk of urinary tract infections (UTIs). Berries, citrus fruits, and leafy greens are known for their high vitamin C content, which can acidify urine and create an environment less favorable for bacteria growth. Additionally, plant-based diets are generally lower in sodium, which can help prevent fluid retention and  reduce the strain on the bladder.  4. Management of Erectile Dysfunction (ED): Some research suggests that a plant-based diet can positively impact erectile function. Plant-based diets are associated with improved cardiovascular health, which is crucial for maintaining healthy blood flow and nerve function required for proper erectile function. By reducing the consumption of high-cholesterol and high-saturated fat animal products, a plant-based diet may contribute to a decreased risk of ED.  5. Prevention of Chronic Conditions: A plant-based diet can help prevent or manage chronic conditions such as obesity, diabetes, and hypertension. These conditions can contribute to urology-related issues, including urinary incontinence and kidney dysfunction. By maintaining a healthy weight and managing these conditions, you can reduce the risk of urology-related complications.  Conclusion: Embracing a plant-based diet can offer significant benefits for urology health. By incorporating a variety of colorful fruits, vegetables, whole grains, nuts, seeds, and legumes into your meals, you can support kidney health, prostate health, bladder function, and overall well-being. Remember to consult with a healthcare professional or registered dietitian before making any significant dietary changes, especially if you have existing health conditions. Your personalized approach to a plant-based diet can contribute to improved urology health and enhance your quality of life.      Prostate Cancer Screening  Prostate cancer screening is testing that is done to check for the presence of prostate cancer in men. The prostate gland is a walnut-sized gland that is located below the bladder and in front of the rectum in males. The function of the prostate is to add fluid to semen during ejaculation. Prostate cancer is one of the most common types of cancer in men. Who should have prostate cancer screening? Screening  recommendations vary based on age and other risk factors, as well as between the professional   organizations who make the recommendations. In general, screening is recommended if: You are age 50 to 70 and have an average risk for prostate cancer. You should talk with your health care provider about your need for screening and how often screening should be done. Because most prostate cancers are slow growing and will not cause death, screening in this age group is generally reserved for men who have a 10- to 15-year life expectancy. You are younger than age 50, and you have these risk factors: Having a father, brother, or uncle who has been diagnosed with prostate cancer. The risk is higher if your family member's cancer occurred at an early age or if you have multiple family members with prostate cancer at an early age. Being a male who is Black or is of Caribbean or sub-Saharan African descent. In general, screening is not recommended if: You are younger than age 40. You are between the ages of 40 and 49 and you have no risk factors. You are 70 years of age or older. At this age, the risks that screening can cause are greater than the benefits that it may provide. If you are at high risk for prostate cancer, your health care provider may recommend that you have screenings more often or that you start screening at a younger age. How is screening for prostate cancer done? The recommended prostate cancer screening test is a blood test called the prostate-specific antigen (PSA) test. PSA is a protein that is made in the prostate. As you age, your prostate naturally produces more PSA. Abnormally high PSA levels may be caused by: Prostate cancer. An enlarged prostate that is not caused by cancer (benign prostatic hyperplasia, or BPH). This condition is very common in older men. A prostate gland infection (prostatitis) or urinary tract infection. Certain medicines such as male hormones (like testosterone)  or other medicines that raise testosterone levels. A rectal exam may be done as part of prostate cancer screening to help provide information about the size of your prostate gland. When a rectal exam is performed, it should be done after the PSA level is drawn to avoid any effect on the results. Depending on the PSA results, you may need more tests, such as: A physical exam to check the size of your prostate gland, if not done as part of screening. Blood and imaging tests. A procedure to remove tissue samples from your prostate gland for testing (biopsy). This is the only way to know for certain if you have prostate cancer. What are the benefits of prostate cancer screening? Screening can help to identify cancer at an early stage, before symptoms start and when the cancer can be treated more easily. There is a small chance that screening may lower your risk of dying from prostate cancer. The chance is small because prostate cancer is a slow-growing cancer, and most men with prostate cancer die from a different cause. What are the risks of prostate cancer screening? The main risk of prostate cancer screening is diagnosing and treating prostate cancer that would never have caused any symptoms or problems. This is called overdiagnosisand overtreatment. PSA screening cannot tell you if your PSA is high due to cancer or a different cause. A prostate biopsy is the only procedure to diagnose prostate cancer. Even the results of a biopsy may not tell you if your cancer needs to be treated. Slow-growing prostate cancer may not need any treatment other than monitoring, so diagnosing and treating it may cause unnecessary stress   or other side effects. Questions to ask your health care provider When should I start prostate cancer screening? What is my risk for prostate cancer? How often do I need screening? What type of screening tests do I need? How do I get my test results? What do my results mean? Do I need  treatment? Where to find more information The American Cancer Society: www.cancer.org American Urological Association: www.auanet.org Contact a health care provider if: You have difficulty urinating. You have pain when you urinate or ejaculate. You have blood in your urine or semen. You have pain in your back or in the area of your prostate. Summary Prostate cancer is a common type of cancer in men. The prostate gland is located below the bladder and in front of the rectum. This gland adds fluid to semen during ejaculation. Prostate cancer screening may identify cancer at an early stage, when the cancer can be treated more easily and is less likely to have spread to other areas of the body. The prostate-specific antigen (PSA) test is the recommended screening test for prostate cancer, but it has associated risks. Discuss the risks and benefits of prostate cancer screening with your health care provider. If you are age 70 or older, the risks that screening can cause are greater than the benefits that it may provide. This information is not intended to replace advice given to you by your health care provider. Make sure you discuss any questions you have with your health care provider. Document Revised: 12/23/2020 Document Reviewed: 12/23/2020 Elsevier Patient Education  2023 Elsevier Inc.  

## 2022-04-30 ENCOUNTER — Ambulatory Visit: Payer: Managed Care, Other (non HMO) | Admitting: Family Medicine

## 2022-05-04 NOTE — Progress Notes (Signed)
PATIENT: Christopher Parsons DOB: 1960/11/08  REASON FOR VISIT: follow up HISTORY FROM: patient  Virtual Visit via Telephone Note  I connected with Christopher Parsons on 05/05/22 at  8:45 AM EDT by telephone and verified that I am speaking with the correct person using two identifiers.   I discussed the limitations, risks, security and privacy concerns of performing an evaluation and management service by telephone and the availability of in person appointments. I also discussed with the patient that there may be a patient responsible charge related to this service. The patient expressed understanding and agreed to proceed.   History of Present Illness:  05/05/22 ALL (mychart): QUINTERIUS GAIDA is a 61 y.o. male here today for follow up for OSA on CPAP. He is doing well. He continues therapy nightly for about 7.5 hours each night. He denies concerns with supplies or machine. He is eligible for a new CPAP. He reports difficulty with previous HST results and had split night study in 2018.     04/30/21 ALL: [name] returns for CPAP follow up. He continues to do well on therapy. He can tell a significant difference in sleep quality and daytime energy levels if he does not use CPAP. He denies concerns with machine or supplies. Set up in 2018. He has noted fluctuating AHI on his mobile app.     04/30/2020 ALL:  KANNAN PROIA is a 61 y.o. male here today for follow up for OSA on CPAP. He is doing very well on therapy. He is using a full face mask and feels it is the best fit for him. He monitors for leak at home but has not been able to identify specific contributors. He does have facial hair that could contribute to leak. He is feeling great and notes benefit of CPAP therapy.   Compliance report dated 03/30/2020 to 04/28/2020 reveals he has used CPAP 30 of the past 30 days for compliance of 100%.  He is CPAP greater than 4 hours 28 of the past 30 days for compliance of 93%.  Average  usage was 6 hours and 44 minutes.  Residual AHI was 6.1 on 10 to 16 cm of water pressure and an EPR of 2.  There was a significant leak noted in the 95th percentile of 33.7.  HISTORY: (copied from Dr Dohmeier's note on 05/01/2019)  UPDATE 05-01-2019, [name], a meanwhile 61 year old male returns for CPAP Compliance.  He reports having a good experience most nights , having good air seals. Many nights there is a problem with the air seal, and he has facial hair and is a mouth breathier as well. BMI 30 kg/m2. He lost 12 pounds.   He has been 100% compliant CPAP user and 90% by time for the 30 days preceding 26 April 2019.  Average use at home is 6 hours and 40 minutes the pressure is set at 12 cmH2O of a 3 cm EPR also his machine allows auto titration.  The residual AHI has risen to 9.6/h and the vast majority of these events is obstructive in nature.  I do think this is possibly at least due to higher air leaks just over the last 14 days in comparison to the previous 15 days I would much like to increase the pressure slightly in order to control the obstructive apneas, hopefully not increasing central apneas in the process.  Observations/Objective:  Generalized: Well developed, in no acute distress  Mentation: Alert oriented to time, place, history  taking. Follows all commands speech and language fluent   Assessment and Plan:  61 y.o. year old male  has a past medical history of GERD (gastroesophageal reflux disease), Hypothyroidism, Mitral valve prolapse, and OSA on CPAP. here with    ICD-10-CM   1. Obstructive sleep apnea treated with continuous positive airway pressure (CPAP)  G47.33 For home use only DME continuous positive airway pressure (CPAP)     Rich is doing well on CPAP. He was encouraged to continue nightly use for at least 4 hours. He is eligible for a new machine. He reports previous difficulty with HST testing. Unclear why. I have sent his note to Dr Brett Fairy for review.  May consider in lab study in needed. He will follow up pending her response.    Orders Placed This Encounter  Procedures   For home use only DME continuous positive airway pressure (CPAP)    Supplies    Order Specific Question:   Length of Need    Answer:   [employer]    Order Specific Question:   Patient has OSA or probable OSA    Answer:   Yes    Order Specific Question:   Is the patient currently using CPAP in the home    Answer:   Yes    Order Specific Question:   Settings    Answer:   Other see comments    Order Specific Question:   CPAP supplies needed    Answer:   Mask, headgear, cushions, filters, heated tubing and water chamber    No orders of the defined types were placed in this encounter.    Follow Up Instructions:  I discussed the assessment and treatment plan with the patient. The patient was provided an opportunity to ask questions and all were answered. The patient agreed with the plan and demonstrated an understanding of the instructions.   The patient was advised to call back or seek an in-person evaluation if the symptoms worsen or if the condition fails to improve as anticipated.  I provided 15 minutes of non-face-to-face time during this encounter. Patient located at their place of residence during [place] visit. Provider is in the office.    Debbora Presto, NP

## 2022-05-04 NOTE — Patient Instructions (Addendum)
Please continue using your CPAP regularly. While your insurance requires that you use CPAP at least 4 hours each night on 70% of the nights, I recommend, that you not skip any nights and use it throughout the night if you can. Getting used to CPAP and staying with the treatment long term does take time and patience and discipline. Untreated obstructive sleep apnea when it is moderate to severe can have an adverse impact on cardiovascular health and raise her risk for heart disease, arrhythmias, hypertension, congestive heart failure, stroke and diabetes. Untreated obstructive sleep apnea causes sleep disruption, nonrestorative sleep, and sleep deprivation. This can have an impact on your day to day functioning and cause daytime sleepiness and impairment of cognitive function, memory loss, mood disturbance, and problems focussing. Using CPAP regularly can improve these symptoms.   We will discuss repeat sleep study with Dr Brett Fairy. Pending her response, we will get back to you on ow to proceed.

## 2022-05-05 ENCOUNTER — Telehealth: Payer: Managed Care, Other (non HMO) | Admitting: Family Medicine

## 2022-05-05 ENCOUNTER — Encounter: Payer: Self-pay | Admitting: Family Medicine

## 2022-05-05 DIAGNOSIS — G4733 Obstructive sleep apnea (adult) (pediatric): Secondary | ICD-10-CM | POA: Diagnosis not present

## 2022-05-13 ENCOUNTER — Ambulatory Visit: Payer: Managed Care, Other (non HMO) | Admitting: Family Medicine

## 2022-05-25 ENCOUNTER — Encounter: Payer: Self-pay | Admitting: Family Medicine

## 2022-05-25 ENCOUNTER — Ambulatory Visit: Payer: Managed Care, Other (non HMO) | Admitting: Family Medicine

## 2022-05-25 VITALS — BP 128/72 | HR 57 | Resp 16 | Ht 70.0 in | Wt 213.7 lb

## 2022-05-25 DIAGNOSIS — Z6830 Body mass index (BMI) 30.0-30.9, adult: Secondary | ICD-10-CM | POA: Diagnosis not present

## 2022-05-25 DIAGNOSIS — Z23 Encounter for immunization: Secondary | ICD-10-CM

## 2022-05-25 DIAGNOSIS — E6609 Other obesity due to excess calories: Secondary | ICD-10-CM

## 2022-05-25 DIAGNOSIS — R1012 Left upper quadrant pain: Secondary | ICD-10-CM

## 2022-05-25 NOTE — Progress Notes (Unsigned)
I,[name],acting as a scribe for [employer], MD.,have documented all relevant documentation on the behalf of Christopher Foster, MD,as directed by  Christopher Foster, MD while in the presence of Christopher Foster, MD.   Established patient visit   Patient: Christopher Parsons   DOB: 1961/02/15   61 y.o. Male  MRN: [id number] Visit Date: 05/25/2022  Today's healthcare provider: [name], MD   Chief Complaint  Patient presents with   Follow-up   Subjective    HPI  Follow up for Abdominal Pain  The patient was last seen for this 3 months ago. Changes made at last visit include discontinue Aspirin. Reports that overall is better but still some discomfort.It it not a daily or constant pain. Sometimes occurs with movement and seems to stay mostly in the LUQ  He has not tried GasEx or heat therapy for the discomfort before   -----------------------------------------------------------------------------------------  Follow up for weight loss  The patient was last seen for this 3 months ago. Changes made at last visit include Diet and exercise . He reports fluctuation in his weight due to changes in the temperature outside, diet, schedule changes    Wt Readings from Last 3 Encounters:  05/25/22 213 lb 11.2 oz (96.9 kg)  03/19/22 207 lb (93.9 kg)  01/19/22 211 lb (95.7 kg)     -----------------------------------------------------------------------------------------  Medications: Outpatient Medications Prior to Visit  Medication Sig   Multiple Vitamin (MULTIVITAMIN) tablet Take 1 tablet by mouth daily.   omeprazole (PRILOSEC) 40 MG capsule TAKE 1 CAPSULE BY MOUTH  DAILY   No facility-administered medications prior to visit.    Review of Systems     Objective    BP 128/72 (BP Location: Right Arm, Patient Position: Sitting, Cuff Size: Large)   Pulse (!) 57   Resp 16   Ht '5\' 10"'$  (1.778 m)   Wt 213 lb 11.2 oz  (96.9 kg)   BMI 30.66 kg/m    Physical Exam Vitals reviewed.  Constitutional:      General: He is not in acute distress.    Appearance: Normal appearance. He is not ill-appearing, toxic-appearing or diaphoretic.  Eyes:     Conjunctiva/sclera: Conjunctivae normal.  Cardiovascular:     Rate and Rhythm: Normal rate and regular rhythm.     Pulses: Normal pulses.     Heart sounds: Normal heart sounds. No murmur heard.    No friction rub. No gallop.  Pulmonary:     Effort: Pulmonary effort is normal. No respiratory distress.     Breath sounds: Normal breath sounds. No stridor. No wheezing, rhonchi or rales.  Abdominal:     General: Bowel sounds are normal. There is no distension.     Palpations: Abdomen is soft.     Tenderness: There is no abdominal tenderness.  Musculoskeletal:     Right lower leg: No edema.     Left lower leg: No edema.  Skin:    Findings: No erythema or rash.  Neurological:     Mental Status: He is alert and oriented to person, place, and time.       No results found for any visits on 05/25/22.  Assessment & Plan     Problem List Items Addressed This Visit       Other   Need for Tdap vaccination    Tdap administered today       Relevant Orders   Tdap vaccine greater than or equal to 7yo IM (Completed)  Colicky LUQ abdominal pain - Primary    Pain is intermittent patient  pain seems to be more associated with fatty meals which she does not consume on a regular basis Recommended that next episode, patient to try Gas-X to see if this is trapped gas related to his pain He can continue the omeprazole as this until his pain is well He has gained weight since last visit which is reassuring sign Patient has no pain today Abdominal exam is benign We will plan on follow-up as needed      Class 1 obesity due to excess calories without serious comorbidity with body mass index (BMI) of 30.0 to 30.9 in adult    Held lengthy discussion about dietary  management, physical activity on a regular basis, tracking foods to help with weight management         Return in about 6 months (around 11/23/2022) for Weight management.      I, Christopher Foster, MD, have reviewed all documentation for this visit. The documentation on 05/26/22 for the exam, diagnosis, procedures, and orders are all accurate and complete.  Portions of this information were initially documented by the CMA and reviewed by me for thoroughness and accuracy.      Christopher Foster, MD  [facility] [phone] (phone) [phone] (fax)  [place]

## 2022-05-26 DIAGNOSIS — E66811 Obesity, class 1: Secondary | ICD-10-CM | POA: Insufficient documentation

## 2022-05-26 DIAGNOSIS — E6609 Other obesity due to excess calories: Secondary | ICD-10-CM | POA: Insufficient documentation

## 2022-05-26 DIAGNOSIS — R1012 Left upper quadrant pain: Secondary | ICD-10-CM | POA: Insufficient documentation

## 2022-05-26 DIAGNOSIS — Z23 Encounter for immunization: Secondary | ICD-10-CM | POA: Insufficient documentation

## 2022-05-26 NOTE — Assessment & Plan Note (Signed)
Pain is intermittent patient  pain seems to be more associated with fatty meals which she does not consume on a regular basis Recommended that next episode, patient to try Gas-X to see if this is trapped gas related to his pain He can continue the omeprazole as this until his pain is well He has gained weight since last visit which is reassuring sign Patient has no pain today Abdominal exam is benign We will plan on follow-up as needed

## 2022-05-26 NOTE — Assessment & Plan Note (Signed)
Held lengthy discussion about dietary management, physical activity on a regular basis, tracking foods to help with weight management

## 2022-05-26 NOTE — Assessment & Plan Note (Signed)
Tdap administered today. 

## 2022-06-11 ENCOUNTER — Encounter: Payer: Self-pay | Admitting: Family Medicine

## 2022-06-11 ENCOUNTER — Other Ambulatory Visit: Payer: Self-pay | Admitting: Family Medicine

## 2022-06-11 DIAGNOSIS — G4733 Obstructive sleep apnea (adult) (pediatric): Secondary | ICD-10-CM

## 2022-06-24 ENCOUNTER — Encounter: Payer: Self-pay | Admitting: Family Medicine

## 2022-07-30 ENCOUNTER — Telehealth: Payer: Self-pay | Admitting: Family Medicine

## 2022-07-30 DIAGNOSIS — G4733 Obstructive sleep apnea (adult) (pediatric): Secondary | ICD-10-CM

## 2022-07-30 NOTE — Telephone Encounter (Signed)
Patient insurance denied the NPSG.. would you want to do a peer to peer or put a HST order in?

## 2022-08-04 NOTE — Telephone Encounter (Signed)
HST- [employer] no auth req ref # [id number].

## 2022-08-25 ENCOUNTER — Ambulatory Visit: Payer: Managed Care, Other (non HMO) | Admitting: Neurology

## 2022-08-25 DIAGNOSIS — G4733 Obstructive sleep apnea (adult) (pediatric): Secondary | ICD-10-CM

## 2022-08-27 NOTE — Progress Notes (Signed)
[facility] TEST REPORT ( by Watch PAT)   STUDY DATE:  08-26-2022 DOB:  06/23/1961    ORDERING CLINICIAN: Debbora Presto, NP  REFERRING CLINICIAN: Miguel Aschoff, MD    CLINICAL INFORMATION/HISTORY: last seen by Debbora Presto, NP in a Video Visit on 05-05-2022;  100% compliant with CPAP use between 10 an 18 cm water pressure, 2 cm EPR and residual AHI was 4.7/h.  05/05/22 ALL (mychart): Christopher Parsons is a 62 y.o. male here today for follow up for OSA on CPAP. He is doing well. He continues therapy nightly for about 7.5 hours each night. He denies concerns with supplies or machine. He is eligible for a new CPAP. He had split night study in 2018. CPAP will be 62 years old .   Epworth sleepiness score: XXX/24. FSS : XXX/63 points.    BMI: XXX kg/m   Neck Circumference: XXX   FINDINGS:   Sleep Summary:   Total Recording Time (hours, min): 7 hours 37 minutes Total Sleep Time (hours, min): 5 hours 43 minutes               Percent REM (%): 19%                                        Respiratory Indices:   Calculated pAHI (per hour): 13/h                           REM pAHI: 10.3/h                                               NREM pAHI: 13.7/h                            Positional AHI: The patient slept mostly in supine position associated with an AHI of 17.5/h and an RDI of 21/h.  This was followed by prone sleep for 96 minutes with an AHI of 10/h, and left-sided sleep with an AHI of 6.3/h.  Snoring statistics show a mean volume of 41 dB and presence of snoring for 70% of total  sleep time.  The sleep architecture was very fragmented.                                                Oxygen Saturation Statistics:   O2 Saturation Range (%): Between a nadir of 88 and a maximum saturation at 99% with a mean saturation at 94%.                                      O2 Saturation (minutes) <89%:     0.1 minutes      Pulse Rate Statistics:   Pulse Mean (bpm): 52  bpm               Pulse Range:   Between 41 and 90 bpm.  IMPRESSION:  This HST confirms the presence of mild overall sleep apnea, more dependent on sleep position than on REM versus non-REM sleep. Based on the high degree of fragmentation I suspect that the patient is CPAP dependent.   RECOMMENDATION: I would like for the patient to continue autotitration CPAP use with anew device by [employer], with  heated humidification and mask or interface of his choice.   The settings should be between 8 and 18 cmH2O pressure with 3 cm EPR soft response,    INTERPRETING PHYSICIAN:   Larey Seat, MD   Medical Director of [facility].

## 2022-08-31 ENCOUNTER — Other Ambulatory Visit: Payer: Self-pay | Admitting: Family Medicine

## 2022-08-31 DIAGNOSIS — G4733 Obstructive sleep apnea (adult) (pediatric): Secondary | ICD-10-CM

## 2022-08-31 NOTE — Procedures (Signed)
HOME SLEEP TEST REPORT ( by Watch PAT)   STUDY DATE:  08-26-2022 DOB:  03/11/1961    ORDERING CLINICIAN: Debbora Presto, NP  REFERRING CLINICIAN: Miguel Aschoff, MD    CLINICAL INFORMATION/HISTORY: last seen by Debbora Presto, NP in a Video Visit on 05-05-2022;  100% compliant with CPAP use between 10 an 18 cm water pressure, 2 cm EPR and residual AHI was 4.7/h.  05/05/22 ALL (mychart): Christopher Parsons is a 62 y.o. male here today for follow up for OSA on CPAP. He is doing well. He continues therapy nightly for about 7.5 hours each night. He denies concerns with supplies or machine. He is eligible for a new CPAP. He had split night study in 2018. CPAP will be 62 years old .   Epworth sleepiness score: XXX/24. FSS : XXX/63 points.    BMI: XXX kg/m   Neck Circumference: XXX   FINDINGS:   Sleep Summary:   Total Recording Time (hours, min): 7 hours 37 minutes Total Sleep Time (hours, min): 5 hours 43 minutes               Percent REM (%): 19%                                        Respiratory Indices:   Calculated pAHI (per hour): 13/h                           REM pAHI: 10.3/h                                               NREM pAHI: 13.7/h                            Positional AHI: The patient slept mostly in supine position associated with an AHI of 17.5/h and an RDI of 21/h.  This was followed by prone sleep for 96 minutes with an AHI of 10/h, and left-sided sleep with an AHI of 6.3/h.  Snoring statistics show a mean volume of 41 dB and presence of snoring for 70% of total  sleep time.  The sleep architecture was very fragmented.                                                Oxygen Saturation Statistics:   O2 Saturation Range (%): Between a nadir of 88 and a maximum saturation at 99% with a mean saturation at 94%.                                      O2 Saturation (minutes) <89%:     0.1 minutes      Pulse Rate Statistics:   Pulse Mean (bpm): 52 bpm               Pulse  Range:   Between 41 and 90 bpm.              IMPRESSION:  This HST confirms the  presence of mild overall sleep apnea, more dependent on sleep position than on REM versus non-REM sleep. Based on the high degree of fragmentation I suspect that the patient is CPAP dependent.   RECOMMENDATION: I would like for the patient to continue autotitration CPAP use with anew device by [employer], with  heated humidification and mask or interface of his choice.   The settings should be between 8 and 18 cmH2O pressure with 3 cm EPR soft response,    INTERPRETING PHYSICIAN:   Larey Seat, MD   Medical Director of [facility].

## 2022-09-24 ENCOUNTER — Telehealth: Payer: Self-pay | Admitting: Family Medicine

## 2022-09-24 ENCOUNTER — Encounter: Payer: Self-pay | Admitting: Family Medicine

## 2022-09-24 NOTE — Telephone Encounter (Signed)
Community message sent to [employer] confirmed with patient my chart same insurance and he would like to continue with [employer].

## 2022-09-24 NOTE — Telephone Encounter (Signed)
Can you guys please check on his CPAP orders placed 2/19. He says he has not heard back from DME. TY!

## 2022-10-30 ENCOUNTER — Encounter: Payer: Self-pay | Admitting: Family Medicine

## 2022-11-02 ENCOUNTER — Other Ambulatory Visit: Payer: Self-pay | Admitting: Family Medicine

## 2022-11-02 DIAGNOSIS — G4733 Obstructive sleep apnea (adult) (pediatric): Secondary | ICD-10-CM

## 2022-11-02 NOTE — Telephone Encounter (Signed)
Amy I have attached his most recent cpap data.

## 2022-11-05 ENCOUNTER — Other Ambulatory Visit: Payer: Self-pay

## 2022-11-05 MED ORDER — OMEPRAZOLE 40 MG PO CPDR: 40.0000 mg | DELAYED_RELEASE_CAPSULE | Freq: Every day | ORAL | 1 refills | Status: DC

## 2022-11-18 ENCOUNTER — Encounter: Payer: Self-pay | Admitting: Neurology

## 2022-12-02 ENCOUNTER — Ambulatory Visit: Payer: Managed Care, Other (non HMO) | Admitting: Neurology

## 2022-12-15 ENCOUNTER — Other Ambulatory Visit: Payer: Managed Care, Other (non HMO)

## 2022-12-15 DIAGNOSIS — R972 Elevated prostate specific antigen [PSA]: Secondary | ICD-10-CM

## 2022-12-17 ENCOUNTER — Encounter: Payer: Self-pay | Admitting: Urology

## 2022-12-17 ENCOUNTER — Ambulatory Visit: Payer: Managed Care, Other (non HMO) | Admitting: Urology

## 2022-12-17 VITALS — BP 137/77 | HR 54 | Ht 70.0 in | Wt 210.3 lb

## 2022-12-17 DIAGNOSIS — R3912 Poor urinary stream: Secondary | ICD-10-CM | POA: Diagnosis not present

## 2022-12-17 DIAGNOSIS — N401 Enlarged prostate with lower urinary tract symptoms: Secondary | ICD-10-CM | POA: Diagnosis not present

## 2022-12-17 DIAGNOSIS — N138 Other obstructive and reflux uropathy: Secondary | ICD-10-CM

## 2022-12-17 DIAGNOSIS — R972 Elevated prostate specific antigen [PSA]: Secondary | ICD-10-CM | POA: Diagnosis not present

## 2022-12-17 DIAGNOSIS — R350 Frequency of micturition: Secondary | ICD-10-CM

## 2022-12-17 DIAGNOSIS — R399 Unspecified symptoms and signs involving the genitourinary system: Secondary | ICD-10-CM

## 2022-12-17 LAB — PSA TOTAL (REFLEX TO FREE): Prostate Specific Ag, Serum: 5.2 ng/mL — ABNORMAL HIGH (ref 0.0–4.0)

## 2022-12-17 LAB — FPSA% REFLEX
% FREE PSA: 17.3 %
PSA, FREE: 0.9 ng/mL

## 2022-12-17 NOTE — Progress Notes (Signed)
   12/17/2022 1:52 PM   Christopher Parsons 06/22/1961 [id number]  Reason for visit: Follow up elevated PSA, BPH  HPI: 62 year old male we have followed for the above issues.  Previously was followed by Dr. Mena Goes as well as Carman Ching, PA.  He has had a slowly rising PSA over the last few years up to 5.9(13% free) in May 2023 that prompted a prostate MRI.  This showed a 65 g gland with reassuring PSA density of 0.09, and a small PI-RADS 3 lesion.  He opted to defer biopsy and continue PSA monitoring.  Most recent repeat PSA has decreased to 5.2(17% free) from June 2024 which is stable over the last few years.  He would like to continue yearly PSA monitoring which I think is very reasonable.  He also has some weak stream, frequency, and mildly bothersome urinary symptoms.  He previously was on Flomax and Rapaflo which improved his urinary stream, however he attributed very bothersome insomnia to those medications and discontinued them.  We discussed a trial of a different alpha-blocker, or consideration of outlet procedure like UroLift or HOLEP.  He is not bothered enough at this time to consider outlet procedures.  He also had a cystoscopy with Dr. Mena Goes in 2021 that showed moderate lateral lobe hypertrophy, with no suspicious lesions.  RTC 1 year PSA reflex to free prior, IPSS/PVR   Sondra Come, MD  [facility] [address], [address] [place], [place] [address] [phone]

## 2022-12-17 NOTE — Patient Instructions (Signed)
Benign Prostatic Hyperplasia  Benign prostatic hyperplasia (BPH) is an enlarged prostate gland that is caused by the normal aging process. The prostate may get bigger as a man gets older. The condition is not caused by cancer. The prostate is a walnut-sized gland that is involved in the production of semen. It is located in front of the rectum and below the bladder. The bladder stores urine. The urethra carries stored urine out of the body. An enlarged prostate can press on the urethra. This can make it harder to pass urine. The buildup of urine in the bladder can cause infection. Back pressure and infection may progress to bladder damage and kidney (renal) failure. What are the causes? This condition is part of the normal aging process. However, not all men develop problems from this condition. If the prostate enlarges away from the urethra, urine flow will not be blocked. If it enlarges toward the urethra and compresses it, there will be problems passing urine. What increases the risk? This condition is more likely to develop in men older than 50 years. What are the signs or symptoms? Symptoms of this condition include: Getting up often during the night to urinate. Needing to urinate frequently during the day. Difficulty starting urine flow. Decrease in size and strength of your urine stream. Leaking (dribbling) after urinating. Inability to pass urine. This needs immediate treatment. Inability to completely empty your bladder. Pain when you pass urine. This is more common if there is also an infection. Urinary tract infection (UTI). How is this diagnosed? This condition is diagnosed based on your medical history, a physical exam, and your symptoms. Tests will also be done, such as: A post-void bladder scan. This measures any amount of urine that may remain in your bladder after you finish urinating. A digital rectal exam. In a rectal exam, your health care provider checks your prostate by  putting a lubricated, gloved finger into your rectum to feel the back of your prostate gland. This exam detects the size of your gland and any abnormal lumps or growths. An exam of your urine (urinalysis). A prostate specific antigen (PSA) screening. This is a blood test used to screen for prostate cancer. An ultrasound. This test uses sound waves to electronically produce a picture of your prostate gland. Your health care provider may refer you to a specialist in kidney and prostate diseases (urologist). How is this treated? Once symptoms begin, your health care provider will monitor your condition (active surveillance or watchful waiting). Treatment for this condition will depend on the severity of your condition. Treatment may include: Observation and yearly exams. This may be the only treatment needed if your condition and symptoms are mild. Medicines to relieve your symptoms, including: Medicines to shrink the prostate. Medicines to relax the muscle of the prostate. Surgery in severe cases. Surgery may include: HOLEP(holmium laser enucleation of the prostate): A laser is used to hollow out a large channel through the prostate to improve urination.  Small risk of long-term leakage, decrease in ejaculate fluid are common, but no changes in testosterone or erections Transurethral resection of the prostate (TURP). In this procedure, a tool is inserted through the opening at the tip of the penis (urethra). It is used to cut away tissue of the inner core of the prostate. The pieces are removed through the same opening of the penis. This removes the blockage. Transurethral incision (TUIP). In this procedure, small cuts are made in the prostate. This lessens the prostate's pressure on the  urethra. Prostatic urethral lift(UROLIFT). This procedure inserts an implant to push the lobes of the prostate away from the urethra. Follow these instructions at home: Take over-the-counter and prescription medicines  only as told by your health care provider. Monitor your symptoms for any changes. Contact your health care provider with any changes. Avoid drinking large amounts of liquid before going to bed or out in public. Avoid or reduce how much caffeine or alcohol you drink. Give yourself time when you urinate. Keep all follow-up visits. This is important. Contact a health care provider if: You have unexplained back pain. Your symptoms do not get better with treatment. You develop side effects from the medicine you are taking. Your urine becomes very dark or has a bad smell. Your lower abdomen becomes distended and you have trouble passing urine. Get help right away if: You have a fever or chills. You suddenly cannot urinate. You feel light-headed or very dizzy, or you faint. There are large amounts of blood or clots in your urine. Your urinary problems become hard to manage. You develop moderate to severe low back or flank pain. The flank is the side of your body between the ribs and the hip. These symptoms may be an emergency. Get help right away. Call 911. Do not wait to see if the symptoms will go away. Do not drive yourself to the hospital. Summary Benign prostatic hyperplasia (BPH) is an enlarged prostate that is caused by the normal aging process. It is not caused by cancer. An enlarged prostate can press on the urethra. This can make it hard to pass urine. This condition is more likely to develop in men older than 50 years. Get help right away if you suddenly cannot urinate. This information is not intended to replace advice given to you by your health care provider. Make sure you discuss any questions you have with your health care provider. Document Revised: 01/15/2021 Document Reviewed: 01/15/2021 Elsevier Patient Education  2024 [employer].

## 2022-12-17 NOTE — Addendum Note (Signed)
Addended by: Frankey Shown on: 12/17/2022 01:59 PM   Modules accepted: Orders

## 2022-12-23 ENCOUNTER — Encounter: Payer: Self-pay | Admitting: Neurology

## 2022-12-23 ENCOUNTER — Ambulatory Visit: Payer: Managed Care, Other (non HMO) | Admitting: Neurology

## 2022-12-23 VITALS — BP 149/79 | HR 70 | Ht 70.0 in | Wt 209.0 lb

## 2022-12-23 DIAGNOSIS — E039 Hypothyroidism, unspecified: Secondary | ICD-10-CM | POA: Diagnosis not present

## 2022-12-23 DIAGNOSIS — G4733 Obstructive sleep apnea (adult) (pediatric): Secondary | ICD-10-CM
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
## 2022-12-23 NOTE — Patient Instructions (Signed)

## 2022-12-28 ENCOUNTER — Telehealth: Payer: Self-pay

## 2023-01-20 NOTE — Progress Notes (Unsigned)
Complete physical exam   Patient: Christopher Parsons   DOB: 01-14-1961   62 y.o. Male  MRN: [id number] Visit Date: 01/21/2023  Today's healthcare provider: Ronnald Ramp, MD   No chief complaint on file.  Subjective    Christopher Parsons is a 62 y.o. male who presents today for a complete physical exam.   He reports consuming a {diet types:17450} diet.   {Exercise:19826} He generally feels {well/fairly well/poorly:18703}.   He reports sleeping {well/fairly well/poorly:18703}.    He {does/does not:200015} have additional problems to discuss today.     Past Medical History:  Diagnosis Date   GERD (gastroesophageal reflux disease)    Hypothyroidism    Mitral valve prolapse    echo 12/14 on paper chart   OSA on CPAP    Past Surgical History:  Procedure Laterality Date   COLONOSCOPY     COLONOSCOPY WITH PROPOFOL N/A 11/06/2021   Procedure: COLONOSCOPY WITH PROPOFOL;  Surgeon: Midge Minium, MD;  Location: [facility] END[facility]COPY;  Service: Endoscopy;  Laterality: N/A;   ESOPHAGEAL DILATION N/A 01/27/2018   Procedure: ESOPHAGEAL DILATION;  Surgeon: Midge Minium, MD;  Location: [facility];  Service: Endoscopy;  Laterality: N/A;   ESOPHAGEAL DILATION  02/28/2021   Procedure: ESOPHAGEAL DILATION;  Surgeon: Midge Minium, MD;  Location: [facility];  Service: Endoscopy;;   ESOPHAGOGASTRODUODENOSCOPY     ESOPHAGOGASTRODUODENOSCOPY (EGD) WITH PROPOFOL N/A 01/18/2015   Procedure: ESOPHAGOGASTRODUODENOSCOPY (EGD) WITH PROPOFOL with dialtion;  Surgeon: Midge Minium, MD;  Location: [facility];  Service: Endoscopy;  Laterality: N/A;   ESOPHAGOGASTRODUODENOSCOPY (EGD) WITH PROPOFOL N/A 05/17/2015   Procedure: ESOPHAGOGASTRODUODENOSCOPY (EGD) WITH PROPOFOL, WITH DIALATION;  Surgeon: Midge Minium, MD;  Location: [facility];  Service: Endoscopy;  Laterality: N/A;   ESOPHAGOGASTRODUODENOSCOPY (EGD) WITH PROPOFOL N/A 12/10/2017   Procedure:  ESOPHAGOGASTRODUODENOSCOPY (EGD) WITH PROPOFOL;  Surgeon: Midge Minium, MD;  Location: [facility];  Service: Endoscopy;  Laterality: N/A;   ESOPHAGOGASTRODUODENOSCOPY (EGD) WITH PROPOFOL N/A 01/27/2018   Procedure: ESOPHAGOGASTRODUODENOSCOPY (EGD) WITH PROPOFOL;  Surgeon: Midge Minium, MD;  Location: [facility];  Service: Endoscopy;  Laterality: N/A;   ESOPHAGOGASTRODUODENOSCOPY (EGD) WITH PROPOFOL N/A 02/28/2021   Procedure: ESOPHAGOGASTRODUODENOSCOPY (EGD) WITH PROPOFOL;  Surgeon: Midge Minium, MD;  Location: [facility];  Service: Endoscopy;  Laterality: N/A;   GANGLION CYST EXCISION Left    wrist   TONSILLECTOMY     VASECTOMY     Social History   Socioeconomic History   Marital status: Married    Spouse name: Not on file   Number of children: 3   Years of education: 12   Highest education level: Not on file  Occupational History   Not on file  Tobacco Use   Smoking status: Never   Smokeless tobacco: Never  Vaping Use   Vaping Use: Never used  Substance and Sexual Activity   Alcohol use: Yes    Alcohol/week: 5.0 standard drinks of alcohol    Types: 5 Shots of liquor per week    Comment: 1-2 drinks daily   Drug use: No   Sexual activity: Yes    Birth control/protection: Surgical    Comment: vasectomy  Other Topics Concern   Not on file  Social History Narrative   Not on file   Social Determinants of Health   Financial Resource Strain: Not on file  Food Insecurity: Not on file  Transportation Needs: Not on file  Physical Activity: Not on file  Stress: Not on file  Social Connections: Not on file  Intimate Partner Violence: Not on file   Family Status  Relation Name Status   Mother  Alive   Father  Deceased   Sister  Alive   Brother  Alive   Brother  Alive   Family History  Problem Relation Age of Onset   Hyperlipidemia Mother    Cancer Father 9       lung cancer   Kidney cancer Father        Underwent nephrectomy   Throat  cancer Father    Healthy Sister    Healthy Brother    Healthy Brother    No Known Allergies   Medications: Outpatient Medications Prior to Visit  Medication Sig   Multiple Vitamin (MULTIVITAMIN) tablet Take 1 tablet by mouth daily.   omeprazole (PRILOSEC) 40 MG capsule Take 1 capsule (40 mg total) by mouth daily.   No facility-administered medications prior to visit.    Review of Systems  {Labs  Heme  Chem  Endocrine  Serology  Results Review (optional):23779}  Objective    There were no vitals taken for this visit. {Show previous vital signs (optional):23777}   Physical Exam  ***  Last depression screening scores    05/25/2022    4:01 PM 01/19/2022    8:56 AM 01/14/2021    9:45 AM  PHQ 2/9 Scores  PHQ - 2 Score 0 0 1  PHQ- 9 Score  1 2    Last fall risk screening    05/25/2022    4:01 PM  Fall Risk   Falls in the past year? 0  Number falls in past yr: 0  Injury with Fall? 0  Risk for fall due to : No Fall Risks    Last Audit-C alcohol use screening    05/25/2022    4:01 PM  Alcohol Use Disorder Test (AUDIT)  1. How often do you have a drink containing alcohol? 4  2. How many drinks containing alcohol do you have on a typical day when you are drinking? 0  3. How often do you have six or more drinks on one occasion? 0  AUDIT-C Score 4  4. How often during the last year have you found that you were not able to stop drinking once you had started? 0  5. How often during the last year have you failed to do what was normally expected from you because of drinking? 0  6. How often during the last year have you needed a first drink in the morning to get yourself going after a heavy drinking session? 0  7. How often during the last year have you had a feeling of guilt of remorse after drinking? 0  8. How often during the last year have you been unable to remember what happened the night before because you had been drinking? 0  9. Have you or someone else been  injured as a result of your drinking? 0  10. Has a relative or friend or a doctor or another health worker been concerned about your drinking or suggested you cut down? 0  Alcohol Use Disorder Identification Test Final Score (AUDIT) 4   A score of 3 or more in women, and 4 or more in men indicates increased risk for alcohol abuse, EXCEPT if all of the points are from question 1   No results found for any visits on 01/21/23.  Assessment & Plan    Routine Health Maintenance and Physical Exam  Immunization  History  Administered Date(s) Administered   Influenza Inj Mdck Quad Pf 06/11/2017   Influenza,inj,Quad PF,6+ Mos 07/04/2013, 03/05/2019   Influenza-Unspecified 06/12/2017, 03/05/2019, 04/02/2020, 04/03/2022   Moderna SARS-COV2 Booster Vaccination 03/29/2021   PFIZER(Purple Top)SARS-COV-2 Vaccination 09/23/2019, 10/17/2019, 05/06/2020   Pfizer Covid-19 Vaccine Bivalent Booster 25yrs & up 10/17/2020   Tdap 06/23/2010, 05/25/2022   Unspecified SARS-COV-2 Vaccination 04/03/2022   Zoster Recombinant(Shingrix) 05/16/2021    Health Maintenance  Topic Date Due   HIV Screening  Never done   Zoster Vaccines- Shingrix (2 of 2) 07/11/2021   COVID-19 Vaccine (7 - 2023-24 season) 05/29/2022   INFLUENZA VACCINE  02/11/2023   Colonoscopy  11/06/2028   DTaP/Tdap/Td (3 - Td or Tdap) 05/25/2032   Hepatitis C Screening  Completed   HPV VACCINES  Aged Out    Problem List Items Addressed This Visit   None    No follow-ups on file.       Ronnald Ramp, MD  [facility] [phone] (phone) [phone] (fax)  [facility]

## 2023-01-21 ENCOUNTER — Ambulatory Visit (INDEPENDENT_AMBULATORY_CARE_PROVIDER_SITE_OTHER): Payer: Managed Care, Other (non HMO) | Admitting: Family Medicine

## 2023-01-21 ENCOUNTER — Encounter: Payer: Self-pay | Admitting: Family Medicine

## 2023-01-21 VITALS — BP 135/81 | HR 52 | Resp 12 | Ht 70.0 in | Wt 208.9 lb

## 2023-01-21 DIAGNOSIS — R972 Elevated prostate specific antigen [PSA]: Secondary | ICD-10-CM

## 2023-01-21 DIAGNOSIS — Z114 Encounter for screening for human immunodeficiency virus [HIV]: Secondary | ICD-10-CM

## 2023-01-21 DIAGNOSIS — Z0000 Encounter for general adult medical examination without abnormal findings: Secondary | ICD-10-CM

## 2023-01-21 DIAGNOSIS — Z13220 Encounter for screening for lipoid disorders: Secondary | ICD-10-CM | POA: Diagnosis not present

## 2023-01-21 DIAGNOSIS — E039 Hypothyroidism, unspecified: Secondary | ICD-10-CM

## 2023-01-21 DIAGNOSIS — Z131 Encounter for screening for diabetes mellitus: Secondary | ICD-10-CM

## 2023-01-21 DIAGNOSIS — Z1159 Encounter for screening for other viral diseases: Secondary | ICD-10-CM

## 2023-01-21 DIAGNOSIS — R1012 Left upper quadrant pain: Secondary | ICD-10-CM | POA: Diagnosis not present

## 2023-01-21 DIAGNOSIS — E559 Vitamin D deficiency, unspecified: Secondary | ICD-10-CM

## 2023-01-21 DIAGNOSIS — E6609 Other obesity due to excess calories: Secondary | ICD-10-CM

## 2023-01-21 DIAGNOSIS — K219 Gastro-esophageal reflux disease without esophagitis: Secondary | ICD-10-CM

## 2023-01-21 DIAGNOSIS — Z6830 Body mass index (BMI) 30.0-30.9, adult: Secondary | ICD-10-CM

## 2023-01-21 DIAGNOSIS — G4733 Obstructive sleep apnea (adult) (pediatric): Secondary | ICD-10-CM

## 2023-01-21 NOTE — Assessment & Plan Note (Signed)
-  Order hemoglobin A1c for diabetes screening. -Order Hepatitis C and HIV screening. -Order lipid panel for lipid screening.

## 2023-01-21 NOTE — Patient Instructions (Addendum)
   VISIT SUMMARY:  During your recent visit, we discussed your ongoing abdominal discomfort and your history of hypothyroidism. We also reviewed your general health and lifestyle habits. Despite the discomfort, you are able to maintain an active lifestyle, which is excellent.  YOUR PLAN:  -ABDOMINAL PAIN: Your abdominal discomfort is still a concern. We're not sure of the cause yet, so we're going to do an ultrasound and some blood tests. If these don't give [place] a clear answer, we may refer you to a specialist in digestive health (a gastroenterologist).  -HYPOTHYROIDISM: Your hypothyroidism, a condition where your thyroid gland doesn't produce enough hormones, seems to be stable with no current symptoms. We'll do some blood tests to keep an eye on your thyroid function.  -GENERAL HEALTH MAINTENANCE: As part of your overall health check, we'll do some additional tests to screen for diabetes, Hepatitis C, and HIV. We'll also check your cholesterol levels.   INSTRUCTIONS:  Please schedule your ultrasound and lab tests as soon as possible. Once we have the results, we'll discuss the next steps. If your abdominal pain becomes more bothersome, please let us know immediately.

## 2023-01-21 NOTE — Assessment & Plan Note (Signed)
Chronic  Stable on omeprazole 40mg  daily

## 2023-01-21 NOTE — Assessment & Plan Note (Signed)
Stable, no current symptoms reported. -Chronic  -stable -Order TSH, free T4, and T3 to monitor thyroid function.

## 2023-01-21 NOTE — Assessment & Plan Note (Signed)
Chronic  Stable  No current vitamin D supplementation  Vitamin D levels ordered

## 2023-01-21 NOTE — Assessment & Plan Note (Signed)
Chronic  Followed by urology  Reports PSA levels are stable

## 2023-01-21 NOTE — Assessment & Plan Note (Signed)
Chronic  Stable with CPAP use  Patient advised to continue CPAP nightly  The patient has had significant benefit from the use of CPAP machine with considerable improvements in quality of life, work production and decrease medical complaints.  The patient will need continued maintenance and care CPAP supplies (including upgrades as deemed appropriate) in order to continue treatment for sleep apnea as this is medically necessary.     

## 2023-01-21 NOTE — Assessment & Plan Note (Signed)
Persistent left upper quadrant pain, described as a knot-like sensation, sensitive to touch. No associated nausea, changes in bowel habits, or blood in stool. Patient is currently on Omeprazole. No clear etiology identified on physical examination. -Chronic -Order abdominal ultrasound to further evaluate. -Order labs including CMP, CBC, and lipase. -Consider referral to gastroenterology if pain becomes more bothersome or if no clear cause is identified from labs and ultrasound.

## 2023-01-26 ENCOUNTER — Ambulatory Visit
Admission: RE | Admit: 2023-01-26 | Discharge: 2023-01-26 | Disposition: A | Discharge status: Home / Self Care | Payer: Managed Care, Other (non HMO) | Source: Ambulatory Visit | Attending: Family Medicine | Admitting: Family Medicine

## 2023-01-26 DIAGNOSIS — R1012 Left upper quadrant pain: Secondary | ICD-10-CM | POA: Diagnosis present

## 2023-01-26 LAB — HEPATITIS C ANTIBODY: Hep C Virus Ab: NONREACTIVE

## 2023-01-26 LAB — CMP14+EGFR
ALT: 18 IU/L (ref 0–44)
AST: 16 IU/L (ref 0–40)
Albumin: 4.4 g/dL (ref 3.9–4.9)
Alkaline Phosphatase: 44 IU/L (ref 44–121)
BUN/Creatinine Ratio: 11 (ref 10–24)
BUN: 11 mg/dL (ref 8–27)
Bilirubin Total: 0.4 mg/dL (ref 0.0–1.2)
CO2: 24 mmol/L (ref 20–29)
Calcium: 9.5 mg/dL (ref 8.6–10.2)
Chloride: 104 mmol/L (ref 96–106)
Creatinine, Ser: 0.97 mg/dL (ref 0.76–1.27)
Globulin, Total: 2.5 g/dL (ref 1.5–4.5)
Glucose: 95 mg/dL (ref 70–99)
Potassium: 4.4 mmol/L (ref 3.5–5.2)
Sodium: 140 mmol/L (ref 134–144)
Total Protein: 6.9 g/dL (ref 6.0–8.5)
eGFR: 88 mL/min/{1.73_m2} (ref >59)

## 2023-01-26 LAB — LIPID PANEL
Chol/HDL Ratio: 4.6 ratio (ref 0.0–5.0)
Cholesterol, Total: 194 mg/dL (ref 100–199)
HDL: 42 mg/dL (ref >39)
LDL Chol Calc (NIH): 126 mg/dL — ABNORMAL HIGH (ref 0–99)
Triglycerides: 148 mg/dL (ref 0–149)
VLDL Cholesterol Cal: 26 mg/dL (ref 5–40)

## 2023-01-26 LAB — CBC
Hematocrit: 44.5 % (ref 37.5–51.0)
Hemoglobin: 15.2 g/dL (ref 13.0–17.7)
MCH: 31.4 pg (ref 26.6–33.0)
MCHC: 34.2 g/dL (ref 31.5–35.7)
MCV: 92 fL (ref 79–97)
Platelets: 306 10*3/uL (ref 150–450)
RBC: 4.84 x10E6/uL (ref 4.14–5.80)
RDW: 13.1 % (ref 11.6–15.4)
WBC: 7.6 10*3/uL (ref 3.4–10.8)

## 2023-01-26 LAB — HEMOGLOBIN A1C
Est. average glucose Bld gHb Est-mCnc: 120 mg/dL
Hgb A1c MFr Bld: 5.8 % — ABNORMAL HIGH (ref 4.8–5.6)

## 2023-01-26 LAB — LIPASE: Lipase: 36 U/L (ref 13–78)

## 2023-01-26 LAB — TSH+T4F+T3FREE
Free T4: 1.29 ng/dL (ref 0.82–1.77)
T3, Free: 3.1 pg/mL (ref 2.0–4.4)
TSH: 3.07 u[IU]/mL (ref 0.450–4.500)

## 2023-01-26 LAB — VITAMIN D 25 HYDROXY (VIT D DEFICIENCY, FRACTURES): Vit D, 25-Hydroxy: 37.8 ng/mL (ref 30.0–100.0)

## 2023-01-26 LAB — HIV ANTIBODY (ROUTINE TESTING W REFLEX): HIV Screen 4th Generation wRfx: NONREACTIVE

## 2023-02-22 NOTE — Progress Notes (Unsigned)
      Established patient visit   Patient: Christopher Parsons   DOB: 10/29/1960   62 y.o. Male  MRN: [id number] Visit Date: 02/23/2023  Today's healthcare provider: Ronnald Ramp, MD   No chief complaint on file.  Subjective       Discussed the use of AI scribe software for clinical note transcription with the patient, who gave verbal consent to proceed.  History of Present Illness             Medications: Outpatient Medications Prior to Visit  Medication Sig   Multiple Vitamin (MULTIVITAMIN) tablet Take 1 tablet by mouth daily.   omeprazole (PRILOSEC) 40 MG capsule Take 1 capsule (40 mg total) by mouth daily.   No facility-administered medications prior to visit.    Review of Systems  {Insert previous labs (optional):23779} {See past labs  Heme  Chem  Endocrine  Serology  Results Review (optional):1}   Objective    There were no vitals taken for this visit. {Insert last BP/Wt (optional):23777}{See vitals history (optional):1}   Physical Exam  ***  No results found for any visits on 02/23/23.  Assessment & Plan     Problem List Items Addressed This Visit   None   Assessment and Plan              No follow-ups on file.         Ronnald Ramp, MD  [facility] [phone] (phone) [phone] (fax)  [facility]

## 2023-02-23 ENCOUNTER — Encounter: Payer: Self-pay | Admitting: Family Medicine

## 2023-02-23 ENCOUNTER — Ambulatory Visit: Payer: Managed Care, Other (non HMO) | Admitting: Family Medicine

## 2023-02-23 VITALS — BP 135/74 | HR 61 | Ht 70.0 in | Wt 211.1 lb

## 2023-02-23 DIAGNOSIS — R1012 Left upper quadrant pain: Secondary | ICD-10-CM

## 2023-02-23 MED ORDER — GABAPENTIN 100 MG PO CAPS: 100.0000 mg | ORAL_CAPSULE | Freq: Every day | ORAL | 1 refills | Status: DC

## 2023-02-23 NOTE — Assessment & Plan Note (Signed)
Chronic, intermittent, and non-specific abdominal pain. Ultrasound and labs including CBC, kidney and liver function tests were normal. No clear triggers identified. Possible post-herpetic neuralgia given history of possible shingles and skin sensitivity. -Start Gabapentin 100mg  at bedtime, with flexibility to increase dose as needed up to 300mg  TID if this does not make him excessively sleepy. -counseled patient to avoid driving/operating heavy machinery after this dose until he knows how the medication will affect him -f/u PRN  -Consider CT scan if pain persists and Gabapentin is ineffective.

## 2023-03-23 ENCOUNTER — Encounter: Payer: Self-pay | Admitting: Family Medicine

## 2023-04-06 ENCOUNTER — Other Ambulatory Visit: Payer: Self-pay | Admitting: Family Medicine

## 2023-05-24 ENCOUNTER — Other Ambulatory Visit: Payer: Self-pay | Admitting: Family Medicine

## 2023-05-24 ENCOUNTER — Encounter: Payer: Self-pay | Admitting: Family Medicine

## 2023-05-24 DIAGNOSIS — I288 Other diseases of pulmonary vessels: Secondary | ICD-10-CM

## 2023-05-24 DIAGNOSIS — I2510 Atherosclerotic heart disease of native coronary artery without angina pectoris: Secondary | ICD-10-CM

## 2023-05-24 DIAGNOSIS — R911 Solitary pulmonary nodule: Secondary | ICD-10-CM

## 2023-05-24 NOTE — Progress Notes (Signed)
Right lower lobe lung nodule follow up Chest Ct ordered

## 2023-05-28 ENCOUNTER — Telehealth: Payer: Self-pay

## 2023-05-28 NOTE — Telephone Encounter (Signed)
Copied from CRM [id number]. Topic: General - Other >> May 28, 2023 11:42 AM Turkey B wrote: Reason for CRM: Joni Reining from [facility],called in states, ct scan with contrast has been denied and pt appt is canceeled. Info to do peer to peer with insurance is,  phone # [phone] and case #[id number]

## 2023-05-31 ENCOUNTER — Inpatient Hospital Stay: Admission: RE | Admit: 2023-05-31 | Payer: Managed Care, Other (non HMO) | Source: Ambulatory Visit

## 2023-05-31 NOTE — Telephone Encounter (Signed)
Please advise patient of below info  Called number provided to complete peer to peer   Spoke with RN reviewer and provided imaging updates for 8.2021 CT and 08/2020 CT results   Case will be submitted for re-consideration   Advised that updated decision can be reviewed online at [email or web address] or call back [phone] , case #[id number]  ) for updates   Ronnald Ramp, MD

## 2023-05-31 NOTE — Telephone Encounter (Signed)
I called patient to advise him of the Peer to Peer and his case being reconsidered. He said he spoke to his insurance about an hour ago and they advised him the CT Scan has been approved. We have not yet received that notification.

## 2023-06-07 ENCOUNTER — Ambulatory Visit
Admission: RE | Admit: 2023-06-07 | Discharge: 2023-06-07 | Disposition: A | Discharge status: Home / Self Care | Payer: Managed Care, Other (non HMO) | Source: Ambulatory Visit | Attending: Family Medicine | Admitting: Family Medicine

## 2023-06-07 DIAGNOSIS — R911 Solitary pulmonary nodule: Secondary | ICD-10-CM

## 2023-06-16 DIAGNOSIS — I2510 Atherosclerotic heart disease of native coronary artery without angina pectoris: Secondary | ICD-10-CM | POA: Insufficient documentation

## 2023-06-16 DIAGNOSIS — I288 Other diseases of pulmonary vessels: Secondary | ICD-10-CM | POA: Insufficient documentation

## 2023-06-16 NOTE — Addendum Note (Signed)
Addended by: Bing Neighbors on: 06/16/2023 05:11 PM   Modules accepted: Orders

## 2023-06-23 ENCOUNTER — Ambulatory Visit
Admission: RE | Admit: 2023-06-23 | Discharge: 2023-06-23 | Disposition: A | Discharge status: Home / Self Care | Payer: Managed Care, Other (non HMO) | Source: Ambulatory Visit | Attending: Family Medicine | Admitting: Family Medicine

## 2023-06-23 DIAGNOSIS — I2510 Atherosclerotic heart disease of native coronary artery without angina pectoris: Secondary | ICD-10-CM | POA: Insufficient documentation

## 2023-07-22 ENCOUNTER — Encounter: Payer: Self-pay | Admitting: Pulmonary Disease

## 2023-07-22 ENCOUNTER — Ambulatory Visit: Payer: Managed Care, Other (non HMO) | Admitting: Pulmonary Disease

## 2023-07-22 VITALS — BP 116/66 | HR 60 | Temp 98.1°F | Ht 70.0 in | Wt 219.8 lb

## 2023-07-22 DIAGNOSIS — R911 Solitary pulmonary nodule: Secondary | ICD-10-CM | POA: Diagnosis not present

## 2023-07-22 DIAGNOSIS — R0989 Other specified symptoms and signs involving the circulatory and respiratory systems: Secondary | ICD-10-CM

## 2023-07-24 ENCOUNTER — Encounter: Payer: Self-pay | Admitting: Pulmonary Disease

## 2023-07-24 NOTE — Progress Notes (Signed)
 Synopsis: Referred in by Simmons-Robinson, Makie*   Subjective:   PATIENT ID: Christopher Parsons GENDER: male DOB: 1960/12/24, MRN: [id number]  Chief Complaint  Patient presents with   Consult    Following up on CT scan done on 06/07/2023.     HPI Christopher Parsons is a 63 year old male patient with a past medical history of gerd presenting today to the pulmonary clinic as a referral from his primary care provider for an enlarged pulmonary trunk on CT coronaries.   He denies any respiratory symptoms. Denies any chest pain. Denies any syncopal events.   Family history - Father passed away of lung cancer.   Social history - Never smoker, drinks 4 to 5 drinks a week. He is currently retired but worked in [employer] previously. Has one dog. Has 3 daughters who are healthy.   ROS All symptoms were reviewed and are negative except for the above. Objective:   Vitals:   07/22/23 0951  BP: 116/66  Pulse: 60  Temp: 98.1 F (36.7 C)  TempSrc: Temporal  SpO2: 97%  Weight: 219 lb 12.8 oz (99.7 kg)  Height: 5' 10 (1.778 m)   97% on RA BMI Readings from Last 3 Encounters:  07/22/23 31.54 kg/m  02/23/23 30.29 kg/m  01/21/23 29.97 kg/m   Wt Readings from Last 3 Encounters:  07/22/23 219 lb 12.8 oz (99.7 kg)  02/23/23 211 lb 1.6 oz (95.8 kg)  01/21/23 208 lb 14.4 oz (94.8 kg)    Physical Exam GEN: NAD, Healthy Appearing HEENT: Supple Neck, Reactive Pupils, EOMI  CVS: Normal S1, Normal S2, RRR, No murmurs or ES appreciated  Lungs: Clear bilateral air entry.  Abdomen: Soft, non tender, non distended, + BS  Extremities: Warm and well perfused, No edema  Skin: No suspicious lesions appreciated  Psych: Normal Affect  Ancillary Information   CBC    Component Value Date/Time   WBC 7.6 01/25/2023 0818   RBC 4.84 01/25/2023 0818   HGB 15.2 01/25/2023 0818   HCT 44.5 01/25/2023 0818   PLT 306 01/25/2023 0818   MCV 92 01/25/2023 0818   MCH 31.4 01/25/2023 0818   MCHC 34.2  01/25/2023 0818   RDW 13.1 01/25/2023 0818   LYMPHSABS 1.2 01/22/2022 0814   EOSABS 0.2 01/22/2022 0814   BASOSABS 0.1 01/22/2022 0814   Labs and imaging were reviewed.      No data to display           Assessment & Plan:  Christopher Parsons is a 63 year old male patient with a past medical history of gerd presenting today to the pulmonary clinic as a referral from his primary care provider for an enlarged pulmonary trunk on CT coronaries.   He is without any respiratory symptoms and his PA trunk on chest CT is borderline measuring 30mm. I do not think he has PH. However we will obtain an echocardiogram to further evaluate.   Furthermore his CT chest showed a 7 mm GG nodule in the right lower lobe for which we will obtain a CT chest in 6 months.   Return in about 6 months (around 01/19/2024).  I spent 60 minutes caring for this patient today, including preparing to see the patient, obtaining a medical history , reviewing a separately obtained history, performing a medically appropriate examination and/or evaluation, counseling and educating the patient/family/caregiver, documenting clinical information in the electronic health record, and independently interpreting results (not separately reported/billed) and communicating results to the patient/family/caregiver  Darrin  Adeana Grilliot, MD [facility] 07/24/2023 3:54 PM

## 2023-08-17 ENCOUNTER — Ambulatory Visit
Admission: RE | Admit: 2023-08-17 | Discharge: 2023-08-17 | Disposition: A | Discharge status: Home / Self Care | Payer: Managed Care, Other (non HMO) | Source: Ambulatory Visit | Attending: Pulmonary Disease

## 2023-08-17 DIAGNOSIS — I341 Nonrheumatic mitral (valve) prolapse: Secondary | ICD-10-CM | POA: Insufficient documentation

## 2023-08-17 DIAGNOSIS — R0989 Other specified symptoms and signs involving the circulatory and respiratory systems: Secondary | ICD-10-CM

## 2023-08-17 DIAGNOSIS — I272 Pulmonary hypertension, unspecified: Secondary | ICD-10-CM | POA: Insufficient documentation

## 2023-08-17 LAB — ECHOCARDIOGRAM COMPLETE
AR max vel: 2.02 cm2
AV Area VTI: 2.19 cm2
AV Area mean vel: 1.93 cm2
AV Mean grad: 3.5 mm[Hg]
AV Peak grad: 6.7 mm[Hg]
Ao pk vel: 1.29 m/s
Area-P 1/2: 3.19 cm2
MV VTI: 1.55 cm2
S' Lateral: 2.9 cm

## 2023-08-17 NOTE — Progress Notes (Signed)
*  PRELIMINARY RESULTS* Echocardiogram 2D Echocardiogram has been performed.  Cristela Blue 08/17/2023, 11:32 AM

## 2023-10-25 ENCOUNTER — Other Ambulatory Visit: Payer: Self-pay

## 2023-10-25 ENCOUNTER — Telehealth: Payer: Self-pay

## 2023-10-25 DIAGNOSIS — R1310 Dysphagia, unspecified: Secondary | ICD-10-CM

## 2023-10-25 NOTE — Telephone Encounter (Signed)
 Left message on voicemail for wife Hope to return my call, ok to speak to her per [facility]

## 2023-10-25 NOTE — Telephone Encounter (Signed)
 Pt's wife called to report pt is complaining of having difficulty swallowing and requesting to schedule EGD for possible dilation. Previous was done 02/2021. Please advise if okay to schedule EGD

## 2023-11-11 ENCOUNTER — Encounter: Payer: Self-pay | Admitting: Pulmonary Disease

## 2023-11-11 ENCOUNTER — Encounter: Payer: Self-pay | Admitting: Gastroenterology

## 2023-11-11 DIAGNOSIS — R911 Solitary pulmonary nodule: Secondary | ICD-10-CM

## 2023-11-11 NOTE — Telephone Encounter (Signed)
 I am sending your message to Dr. Lucina Sabal. He is out of the office until Monday. I will let you know is response once he returns.  Merritt Ables, CMA

## 2023-11-11 NOTE — Telephone Encounter (Signed)
 Please advise on his Echo results and when you want the follow up CT done. I see Jun and Nov in the chart.

## 2023-11-15 NOTE — Telephone Encounter (Signed)
 Hi Team,  Sorry for the confusion, CT chest to be scheduled in Nov 2026. No need for follow up apt this year.

## 2023-11-15 NOTE — Telephone Encounter (Signed)
 Since the CT order was place 07/2023 the order will expire and we will need new order for 05/2025

## 2023-11-17 NOTE — Telephone Encounter (Signed)
New CT order has been placed.  

## 2023-11-18 ENCOUNTER — Other Ambulatory Visit: Payer: Self-pay

## 2023-11-18 ENCOUNTER — Encounter
Admission: RE | Disposition: A | Discharge status: Home / Self Care | Payer: Self-pay | Source: Home / Self Care | Attending: Internal Medicine

## 2023-11-18 ENCOUNTER — Ambulatory Visit: Admitting: Anesthesiology

## 2023-11-18 ENCOUNTER — Encounter: Payer: Self-pay | Admitting: Internal Medicine

## 2023-11-18 ENCOUNTER — Ambulatory Visit
Admission: RE | Admit: 2023-11-18 | Discharge: 2023-11-18 | Disposition: A | Discharge status: Home / Self Care | Attending: Internal Medicine | Admitting: Internal Medicine

## 2023-11-18 DIAGNOSIS — R1310 Dysphagia, unspecified: Secondary | ICD-10-CM | POA: Insufficient documentation

## 2023-11-18 DIAGNOSIS — K222 Esophageal obstruction: Secondary | ICD-10-CM | POA: Insufficient documentation

## 2023-11-18 DIAGNOSIS — K219 Gastro-esophageal reflux disease without esophagitis: Secondary | ICD-10-CM | POA: Diagnosis not present

## 2023-11-18 DIAGNOSIS — G4733 Obstructive sleep apnea (adult) (pediatric): Secondary | ICD-10-CM | POA: Diagnosis not present

## 2023-11-18 DIAGNOSIS — K317 Polyp of stomach and duodenum: Secondary | ICD-10-CM | POA: Insufficient documentation

## 2023-11-18 HISTORY — DX: Cardiac murmur, unspecified: R01.1

## 2023-11-18 SURGERY — EGD (ESOPHAGOGASTRODUODENOSCOPY)
Anesthesia: General

## 2023-11-18 MED ORDER — DEXMEDETOMIDINE HCL IN NACL 80 MCG/20ML IV SOLN
INTRAVENOUS | Status: DC | PRN
  Administered 2023-11-18: 8 ug via INTRAVENOUS
  Administered 2023-11-18: 12 ug via INTRAVENOUS

## 2023-11-18 MED ORDER — GLYCOPYRROLATE 0.2 MG/ML IJ SOLN
INTRAMUSCULAR | Status: DC | PRN
  Administered 2023-11-18: .2 mg via INTRAVENOUS

## 2023-11-18 MED ORDER — GLYCOPYRROLATE 0.2 MG/ML IJ SOLN
INTRAMUSCULAR | Status: AC
  Filled 2023-11-18: qty 1

## 2023-11-18 MED ORDER — LIDOCAINE HCL (CARDIAC) PF 100 MG/5ML IV SOSY
PREFILLED_SYRINGE | INTRAVENOUS | Status: DC | PRN
  Administered 2023-11-18: 80 mg via INTRAVENOUS

## 2023-11-18 MED ORDER — SODIUM CHLORIDE 0.9 % IV SOLN: INTRAVENOUS | Status: DC

## 2023-11-18 MED ORDER — PROPOFOL 1000 MG/100ML IV EMUL
INTRAVENOUS | Status: AC
  Filled 2023-11-18: qty 100

## 2023-11-18 MED ORDER — PROPOFOL 500 MG/50ML IV EMUL
INTRAVENOUS | Status: DC | PRN
  Administered 2023-11-18: 75 ug/kg/min via INTRAVENOUS

## 2023-11-18 MED ORDER — PROPOFOL 10 MG/ML IV BOLUS
INTRAVENOUS | Status: DC | PRN
  Administered 2023-11-18 (×2): 50 mg via INTRAVENOUS

## 2023-11-18 MED ORDER — LIDOCAINE HCL (PF) 2 % IJ SOLN
INTRAMUSCULAR | Status: AC
  Filled 2023-11-18: qty 5

## 2023-11-18 MED ORDER — DEXMEDETOMIDINE HCL IN NACL 80 MCG/20ML IV SOLN
INTRAVENOUS | Status: AC
  Filled 2023-11-18: qty 20

## 2023-11-18 NOTE — Op Note (Signed)
 [facility] Patient Name: Christopher Parsons Procedure Date: 11/18/2023 12:14 PM MRN: [id number] Account #: [id number] Date of Birth: 1961/01/17 Admit Type: Outpatient Age: 63 Room: [facility] ENDO ROOM 4 Gender: Male Note Status: Finalized Instrument Name: Upper Endoscope [id number] Procedure:             Upper GI endoscopy Indications:           Dysphagia, Gastro-esophageal reflux disease Providers:             Antwaine Boomhower K. Corky Diener MD, MD Referring MD:          Dori Garbe (Referring MD) Medicines:             Propofol  per Anesthesia Complications:         No immediate complications. Estimated blood loss: None. Procedure:             Pre-Anesthesia Assessment:                        - The risks and benefits of the procedure and the                         sedation options and risks were discussed with the                         patient. All questions were answered and informed                         consent was obtained.                        - Patient identification and proposed procedure were                         verified prior to the procedure by the nurse. The                         procedure was verified in the procedure room.                        - ASA Grade Assessment: III - A patient with severe                         systemic disease.                        - After reviewing the risks and benefits, the patient                         was deemed in satisfactory condition to undergo the                         procedure.                        After obtaining informed consent, the endoscope was                         passed under direct vision. Throughout the procedure,  the patient's blood pressure, pulse, and oxygen                         saturations were monitored continuously. The Endoscope                         was introduced through the mouth, and advanced to the                         third part of  duodenum. The upper GI endoscopy was                         accomplished without difficulty. The patient tolerated                         the procedure well. Findings:      One benign-appearing, intrinsic mild stenosis was found at the       gastroesophageal junction. This stenosis measured 1.5 cm (inner       diameter) x less than one cm (in length). The stenosis was traversed.       The scope was withdrawn. Dilation was performed with a Maloney dilator       with no resistance at 54 Fr.      The exam of the esophagus was otherwise normal.      A few small sessile polyps with no bleeding and no stigmata of recent       bleeding were found in the gastric body.      No other significant abnormalities were identified in a careful       examination of the stomach.      The examined duodenum was normal. Impression:            - Benign-appearing esophageal stenosis. Dilated.                        - A few gastric polyps.                        - Normal examined duodenum.                        - No specimens collected. Recommendation:        - Patient has a contact number available for                         emergencies. The signs and symptoms of potential                         delayed complications were discussed with the patient.                         Return to normal activities tomorrow. Written                         discharge instructions were provided to the patient.                        - Resume previous diet.                        -  Continue present medications.                        - Monitor results to esophageal dilation                        - Return to GI office in 3 months.                        - Telephone GI office to schedule appointment.                        - The findings and recommendations were discussed with                         the patient. Procedure Code(s):     --- Professional ---                        [id number], Esophagogastroduodenoscopy,  flexible,                         transoral; diagnostic, including collection of                         specimen(s) by brushing or washing, when performed                         (separate procedure)                        43450, Dilation of esophagus, by unguided sound or                         bougie, single or multiple passes Diagnosis Code(s):     --- Professional ---                        K21.9, Gastro-esophageal reflux disease without                         esophagitis                        K31.7, Polyp of stomach and duodenum                        R13.10, Dysphagia, unspecified                        K22.2, Esophageal obstruction CPT copyright 2022 American Medical Association. All rights reserved. The codes documented in this report are preliminary and upon coder review may  be revised to meet current compliance requirements. Cassie Click MD, MD 11/18/2023 12:34:56 PM This report has been signed electronically. Number of Addenda: 0 Note Initiated On: 11/18/2023 12:14 PM Estimated Blood Loss:  Estimated blood loss: none.      [facility]

## 2023-11-18 NOTE — Anesthesia Postprocedure Evaluation (Signed)
 Anesthesia Post Note  Patient: Christopher Parsons  Procedure(s) Performed: EGD (ESOPHAGOGASTRODUODENOSCOPY)  Patient location during evaluation: PACU Anesthesia Type: General Level of consciousness: awake and alert Pain management: pain level controlled Vital Signs Assessment: post-procedure vital signs reviewed and stable Respiratory status: spontaneous breathing, nonlabored ventilation, respiratory function stable and patient connected to nasal cannula oxygen Cardiovascular status: blood pressure returned to baseline and stable Postop Assessment: no apparent nausea or vomiting Anesthetic complications: no   No notable events documented.   Last Vitals:  Vitals:   11/18/23 1245 11/18/23 1255  BP: 103/64 118/76  Pulse: (!) 50 (!) 49  Resp: 12 (!) 22  Temp:    SpO2: 98% 97%    Last Pain:  Vitals:   11/18/23 1245  TempSrc:   PainSc: 0-No pain                 Zula Hitch

## 2023-11-18 NOTE — H&P (Signed)
 Outpatient short stay form Pre-procedure 11/18/2023 12:11 PM Townsend Cudworth K. Corky Diener, M.D.  Primary Physician: Mimi Alt, M.D.  Reason for visit:  Dysphagia  History of present illness:  patient with over 6 month dysphagia with hx of GERD controlled on PPI therapy. Denies hemetemesis, abdominal pain.     Current Facility-Administered Medications:    0.9 %  sodium chloride  infusion, , Intravenous, Continuous, Wohl, Darren, MD, Last Rate: 20 mL/hr at 11/18/23 1211, Continued from Pre-op at 11/18/23 1211  Medications Prior to Admission  Medication Sig Dispense Refill Last Dose/Taking   omeprazole  (PRILOSEC) 40 MG capsule TAKE 1 CAPSULE BY MOUTH DAILY 90 capsule 3 11/17/2023   Multiple Vitamin (MULTIVITAMIN) tablet Take 1 tablet by mouth daily.   11/14/2023     No Known Allergies   Past Medical History:  Diagnosis Date   GERD (gastroesophageal reflux disease)    Heart murmur    Hypothyroidism    Mitral valve prolapse    echo 12/14 on paper chart   OSA on CPAP     Review of systems:  Otherwise negative.    Physical Exam  Gen: Alert, oriented. Appears stated age.  HEENT: [address]/AT. PERRLA. Lungs: CTA, no wheezes. CV: RR nl S1, S2. Abd: soft, benign, no masses. BS+ Ext: No edema. Pulses 2+    Planned procedures: Proceed with EGD. The patient understands the nature of the planned procedure, indications, risks, alternatives and potential complications including but not limited to bleeding, infection, perforation, damage to internal organs and possible oversedation/side effects from anesthesia. The patient agrees and gives consent to proceed.  Please refer to procedure notes for findings, recommendations and patient disposition/instructions.     Aasia Peavler K. Corky Diener, M.D. Gastroenterology 11/18/2023  12:11 PM

## 2023-11-18 NOTE — Interval H&P Note (Signed)
 History and Physical Interval Note:  11/18/2023 12:23 PM  Christopher Parsons  has presented today for surgery, with the diagnosis of dysphagia.  The various methods of treatment have been discussed with the patient and family. After consideration of risks, benefits and other options for treatment, the patient has consented to  Procedure(s): EGD (ESOPHAGOGASTRODUODENOSCOPY) (N/A) as a surgical intervention.  The patient's history has been reviewed, patient examined, no change in status, stable for surgery.  I have reviewed the patient's chart and labs.  Questions were answered to the patient's satisfaction.     Clayton, Mirra Basilio

## 2023-11-18 NOTE — Transfer of Care (Signed)
 Immediate Anesthesia Transfer of Care Note  Patient: Christopher Parsons  Procedure(s) Performed: EGD (ESOPHAGOGASTRODUODENOSCOPY)  Patient Location: PACU  Anesthesia Type:General  Level of Consciousness: sedated  Airway & Oxygen Therapy: Patient Spontanous Breathing  Post-op Assessment: Report given to RN and Post -op Vital signs reviewed and stable  Post vital signs: Reviewed and stable  Last Vitals:  Vitals Value Taken Time  BP    Temp    Pulse    Resp    SpO2      Last Pain:  Vitals:   11/18/23 1137  TempSrc: Temporal  PainSc: 0-No pain         Complications: No notable events documented.

## 2023-11-18 NOTE — Anesthesia Preprocedure Evaluation (Signed)
 Anesthesia Evaluation  Patient identified by MRN, date of birth, ID band Patient awake    Reviewed: Allergy & Precautions, H&P , NPO status , Patient's Chart, lab work & pertinent test results, reviewed documented beta blocker date and time   Airway Mallampati: II   Neck ROM: full    Dental  (+) Poor Dentition   Pulmonary sleep apnea and Continuous Positive Airway Pressure Ventilation    Pulmonary exam normal        Cardiovascular Exercise Tolerance: Good + CAD  Normal cardiovascular exam+ Valvular Problems/Murmurs  Rhythm:regular Rate:Normal     Neuro/Psych negative neurological ROS  negative psych ROS   GI/Hepatic Neg liver ROS,GERD  Medicated,,  Endo/Other  Hypothyroidism    Renal/GU negative Renal ROS  negative genitourinary   Musculoskeletal   Abdominal   Peds  Hematology negative hematology ROS (+)   Anesthesia Other Findings Past Medical History: No date: GERD (gastroesophageal reflux disease) No date: Heart murmur No date: Hypothyroidism No date: Mitral valve prolapse     Comment:  echo 12/14 on paper chart No date: OSA on CPAP Past Surgical History: No date: COLONOSCOPY 11/06/2021: COLONOSCOPY WITH PROPOFOL ; N/A     Comment:  Procedure: COLONOSCOPY WITH PROPOFOL ;  Surgeon: Marnee Sink, MD;  Location: [facility] ENDOSCOPY;  Service:               Endoscopy;  Laterality: N/A; 01/27/2018: ESOPHAGEAL DILATION; N/A     Comment:  Procedure: ESOPHAGEAL DILATION;  Surgeon: Marnee Sink,               MD;  Location: [facility];  Service: Endoscopy;               Laterality: N/A; 02/28/2021: ESOPHAGEAL DILATION     Comment:  Procedure: ESOPHAGEAL DILATION;  Surgeon: Marnee Sink,               MD;  Location: [facility];  Service: Endoscopy;; No date: ESOPHAGOGASTRODUODENOSCOPY 01/18/2015: ESOPHAGOGASTRODUODENOSCOPY (EGD) WITH PROPOFOL ; N/A     Comment:  Procedure:  ESOPHAGOGASTRODUODENOSCOPY (EGD) WITH               PROPOFOL  with dialtion;  Surgeon: Marnee Sink, MD;                Location: [facility];  Service: Endoscopy;                Laterality: N/A; 05/17/2015: ESOPHAGOGASTRODUODENOSCOPY (EGD) WITH PROPOFOL ; N/A     Comment:  Procedure: ESOPHAGOGASTRODUODENOSCOPY (EGD) WITH               PROPOFOL , WITH DIALATION;  Surgeon: Marnee Sink, MD;                Location: [facility];  Service: Endoscopy;                Laterality: N/A; 12/10/2017: ESOPHAGOGASTRODUODENOSCOPY (EGD) WITH PROPOFOL ; N/A     Comment:  Procedure: ESOPHAGOGASTRODUODENOSCOPY (EGD) WITH               PROPOFOL ;  Surgeon: Marnee Sink, MD;  Location: [facility]               SURGERY [facility];  Service: Endoscopy;  Laterality: N/A; 01/27/2018: ESOPHAGOGASTRODUODENOSCOPY (EGD) WITH PROPOFOL ; N/A     Comment:  Procedure: ESOPHAGOGASTRODUODENOSCOPY (EGD) WITH               PROPOFOL ;  Surgeon:  Marnee Sink, MD;  Location: [facility]               SURGERY CNTR;  Service: Endoscopy;  Laterality: N/A; 02/28/2021: ESOPHAGOGASTRODUODENOSCOPY (EGD) WITH PROPOFOL ; N/A     Comment:  Procedure: ESOPHAGOGASTRODUODENOSCOPY (EGD) WITH               PROPOFOL ;  Surgeon: Marnee Sink, MD;  Location: [facility]               SURGERY CN[facility];  Service: Endoscopy;  Laterality: N/A; No date: GANGLION CYST EXCISION; Left     Comment:  wrist No date: TONSILLECTOMY No date: VASECTOMY BMI    Body Mass Index: 30.56 kg/m     Reproductive/Obstetrics negative OB ROS                             Anesthesia Physical Anesthesia Plan  ASA: 3  Anesthesia Plan: General   Post-op Pain Management:    Induction:   PONV Risk Score and Plan:   Airway Management Planned:   Additional Equipment:   Intra-op Plan:   Post-operative Plan:   Informed Consent: I have reviewed the patients History and Physical, chart, labs and discussed the procedure including the risks, benefits and  alternatives for the proposed anesthesia with the patient or authorized representative who has indicated his/her understanding and acceptance.     Dental Advisory Given  Plan Discussed with: CRNA  Anesthesia Plan Comments:        Anesthesia Quick Evaluation

## 2023-11-19 ENCOUNTER — Encounter: Payer: Self-pay | Admitting: Internal Medicine

## 2023-11-29 ENCOUNTER — Encounter: Payer: Self-pay | Admitting: Family Medicine

## 2023-11-29 NOTE — Telephone Encounter (Signed)
 Please advise if the pt needs to move is appt back

## 2023-12-17 ENCOUNTER — Other Ambulatory Visit: Payer: Self-pay

## 2023-12-17 DIAGNOSIS — R972 Elevated prostate specific antigen [PSA]: Secondary | ICD-10-CM

## 2023-12-18 LAB — PSA TOTAL (REFLEX TO FREE): Prostate Specific Ag, Serum: 3.9 ng/mL (ref 0.0–4.0)

## 2023-12-23 ENCOUNTER — Encounter: Payer: Self-pay | Admitting: Urology

## 2023-12-23 ENCOUNTER — Ambulatory Visit: Payer: Self-pay | Admitting: Urology

## 2023-12-23 VITALS — BP 154/80 | HR 57 | Ht 70.0 in | Wt 213.0 lb

## 2023-12-23 DIAGNOSIS — N401 Enlarged prostate with lower urinary tract symptoms: Secondary | ICD-10-CM

## 2023-12-23 DIAGNOSIS — R972 Elevated prostate specific antigen [PSA]: Secondary | ICD-10-CM

## 2023-12-23 DIAGNOSIS — N138 Other obstructive and reflux uropathy: Secondary | ICD-10-CM | POA: Diagnosis not present

## 2023-12-23 LAB — BLADDER SCAN AMB NON-IMAGING: Scan Result: 107

## 2023-12-23 NOTE — Patient Instructions (Signed)
 Prostate Cancer Screening  Prostate cancer screening is testing that is done to check for the presence of prostate cancer in men. The prostate gland is a walnut-sized gland that is located below the bladder and in front of the rectum in males. The function of the prostate is to add fluid to semen during ejaculation. Prostate cancer is one of the most common types of cancer in men. Who should have prostate cancer screening? Screening recommendations vary based on age and other risk factors, as well as between the professional organizations who make the recommendations. In general, screening is recommended if: You are age 1 to 36 and have an average risk for prostate cancer. You should talk with your health care provider about your need for screening and how often screening should be done. Because most prostate cancers are slow growing and will not cause death, screening in this age group is generally reserved for men who have a 10- to 15-year life expectancy. You are younger than age 17, and you have these risk factors: Having a father, brother, or uncle who has been diagnosed with prostate cancer. The risk is higher if your family member's cancer occurred at an early age or if you have multiple family members with prostate cancer at an early age. Being a male who is [place] or is of [place] or sub-Saharan African descent. In general, screening is not recommended if: You are younger than age 50. You are between the ages of 60 and 3 and you have no risk factors. You are 70 years of age or older. At this age, the risks that screening can cause are greater than the benefits that it may provide. If you are at high risk for prostate cancer, your health care provider may recommend that you have screenings more often or that you start screening at a younger age. How is screening for prostate cancer done? The recommended prostate cancer screening test is a blood test called the prostate-specific antigen  (PSA) test. PSA is a protein that is made in the prostate. As you age, your prostate naturally produces more PSA. Abnormally high PSA levels may be caused by: Prostate cancer. An enlarged prostate that is not caused by cancer (benign prostatic hyperplasia, or BPH). This condition is very common in older men. A prostate gland infection (prostatitis) or urinary tract infection. Certain medicines such as male hormones (like testosterone) or other medicines that raise testosterone levels. A rectal exam may be done as part of prostate cancer screening to help provide information about the size of your prostate gland. When a rectal exam is performed, it should be done after the PSA level is drawn to avoid any effect on the results. Depending on the PSA results, you may need more tests, such as: A physical exam to check the size of your prostate gland, if not done as part of screening. Blood and imaging tests. A procedure to remove tissue samples from your prostate gland for testing (biopsy). This is the only way to know for certain if you have prostate cancer. What are the benefits of prostate cancer screening? Screening can help to identify cancer at an early stage, before symptoms start and when the cancer can be treated more easily. There is a small chance that screening may lower your risk of dying from prostate cancer. The chance is small because prostate cancer is a slow-growing cancer, and most men with prostate cancer die from a different cause. What are the risks of prostate cancer screening? The  main risk of prostate cancer screening is diagnosing and treating prostate cancer that would never have caused any symptoms or problems. This is called overdiagnosisand overtreatment. PSA screening cannot tell you if your PSA is high due to cancer or a different cause. A prostate biopsy is the only procedure to diagnose prostate cancer. Even the results of a biopsy may not tell you if your cancer needs to  be treated. Slow-growing prostate cancer may not need any treatment other than monitoring, so diagnosing and treating it may cause unnecessary stress or other side effects. Questions to ask your health care provider When should I start prostate cancer screening? What is my risk for prostate cancer? How often do I need screening? What type of screening tests do I need? How do I get my test results? What do my results mean? Do I need treatment? Where to find more information The American Cancer Society: www.cancer.org American Urological Association: www.auanet.org Contact a health care provider if: You have difficulty urinating. You have pain when you urinate or ejaculate. You have blood in your urine or semen. You have pain in your back or in the area of your prostate. Summary Prostate cancer is a common type of cancer in men. The prostate gland is located below the bladder and in front of the rectum. This gland adds fluid to semen during ejaculation. Prostate cancer screening may identify cancer at an early stage, when the cancer can be treated more easily and is less likely to have spread to other areas of the body. The prostate-specific antigen (PSA) test is the recommended screening test for prostate cancer, but it has associated risks. Discuss the risks and benefits of prostate cancer screening with your health care provider. If you are age 81 or older, the risks that screening can cause are greater than the benefits that it may provide. This information is not intended to replace advice given to you by your health care provider. Make sure you discuss any questions you have with your health care provider. Nocturia refers to the need to wake up during the night to urinate, which can disrupt your sleep and impact your overall well-being. Fortunately, there are several strategies you can employ to help prevent or manage nocturia. It's important to consult with your healthcare provider before  making any significant changes to your routine. Here are some helpful strategies to consider:  Limit Fluid Intake Before Bed: Avoid drinking large amounts of fluids in the evening, especially within a few hours of bedtime. Consume most of your daily fluid intake earlier in the day to reduce the need to urinate at night.  Monitor Your Diet: Limit your intake of caffeine and alcohol, as these substances can increase urine production and irritate the bladder.  Avoid diet, "zero calorie," and artificially sweetened drinks, especially sodas, in the afternoon or evening. Be mindful of consuming foods and drinks with high water content before bedtime, such as watermelon and herbal teas.  Time Your Medications: If you're taking medications that contribute to increased urination, consult your healthcare provider about adjusting the timing of these medications to minimize their impact during the night.  Practice Double Voiding: Before going to bed, make an effort to empty your bladder twice within a short period. This can help reduce the amount of urine left in your bladder before sleep.  Bladder Training: Gradually increase the time between bathroom visits during the day to train your bladder to hold larger volumes of urine. Over time, this can help reduce  the frequency of nighttime awakenings to urinate.  Elevate Your Legs During the Day: Elevating your legs during the day can help minimize fluid retention in your lower extremities, which might reduce nighttime urination.  Pelvic Floor Exercises: Strengthening your pelvic floor muscles through Kegel exercises can help improve bladder control and potentially reduce the urge to urinate at night.  Create a Relaxing Bedtime Routine: Stress and anxiety can exacerbate nocturia. Engage in calming activities before bed, such as reading, listening to soothing music, or practicing relaxation techniques.  Stay Active: Engage in regular physical activity,  but avoid intense exercise close to bedtime, as this can increase your body's demand for fluids.  Maintain a Healthy Weight: Excess weight can compress the bladder and contribute to bladder and urinary issues. Aim to achieve and maintain a healthy weight through a balanced diet and regular exercise.  Remember that every individual is unique, and the effectiveness of these strategies may vary. It's important to work with your healthcare provider to develop a plan that suits your specific needs and addresses any underlying causes of nocturia.

## 2023-12-23 NOTE — Progress Notes (Signed)
   12/23/2023 2:01 PM   Christopher Parsons February 13, 1961 [id number]  Reason for visit: Follow up elevated PSA, BPH  HPI: 63 year old male we have followed for the above issues.  Previously was followed by Dr. Derrick Fling as well as Kathreen Pare, PA.  He had a slowly rising PSA over the last few years up to 5.9(13% free) in May 2023 that prompted a prostate MRI.  This showed a 65 g gland with reassuring PSA density of 0.09, and a small PI-RADS 3 lesion.  He opted to defer biopsy and continue PSA monitoring.  Most recent PSA from June 2025 actually back to normal at 3.9.  Reassurance was provided.  He would like to return to routine screening with PCP which I think is reasonable.  Would consider repeat prostate MRI if PSA persistently rises above 7.  He has minimal urinary symptoms with some occasional urgency, this is managed by minimizing fluids prior to long trips and prior to bedtime.  He had bothersome side effects from alpha blockers, not interested in other medications or outlet procedures at this time.  He had a cystoscopy in 2021 with Dr. Derrick Fling that showed moderate lateral lobe hypertrophy but was otherwise benign.  PVR today normal at 100  He prefers follow-up with PCP, would consider referral back to urology if PSA over 7  Behavioral strategies discussed regarding urinary symptoms  Lawerence Pressman, MD  [facility] [address] [place], [place] [address] [phone]

## 2023-12-28 NOTE — Progress Notes (Signed)
 Christopher Parsons

## 2023-12-28 NOTE — Progress Notes (Signed)
 PATIENT: Christopher Parsons DOB: Jul 21, 1960  REASON FOR VISIT: follow up HISTORY FROM: patient  Chief Complaint  Patient presents with   RM1/CPAP    Pt is here Alone. Pt states that things have been going fine with his CPAP Machine.       HISTORY OF PRESENT ILLNESS:  12/29/23 ALL: Jaking returns for follow up for OSA on CPAP. He was last seen by Dr Albertina Hugger 12/2022. She adjusted pressures due to higher residual AHI on new machine. Max pressure reduced to 16 and EPR set at 2. Since, he reports doing well. He is tolerating pressure changes. He does note significant improvement in sleep quality and daytime energy on therapy. He denies concerns with machine or supplies. Using FFM. He is requesting a letter to travel with CPAP. He is leaving for [place] next week for his daughter's graduation gift.      12/23/2022 CD: Christopher Parsons is a 63 y.o. male patient who is here for revisit 12/23/2022 for  follow up on his new CPAP device. .  Chief concern according to patient :  with my new machine I have a bit more frequent events now. But overall ,sleep without CPAP machine is not nearly having the quality of CPAP sleep. Hi last machine was set for 9 cm water , now he was ordered an AUTO-titration ResMed device.    A PSG from 05/23/17 revealed mild Obstructive Sleep Apnea (AHI 13.1), accentuated by supine sleep (37.5/hr.) and during REM sleep (AHI of 14.8/hr.). Mild PLMs were clustered during one hour of NREM sleep on the patient's left side - seems to be position dependent.    HST :  Terrilyn Fick, NP, had  ordered a repeat sleep study: 08-25-2022. MILD OSA.    The new machine is set between 8 and 18 cmH2O with 3 cm expiratory pressure relief and interestingly Mr. Mitro 95th percentile pressure is now 14 cm water  -was previously at 9 cm !!.  So he did need a higher pressure he did have some central apneas arising but his majority of events are obstructive and his residual apnea index is  6.0 consisting of 4.4 obstructive and 1.5 central apneas per hour of sleep.  He does have minimal air leakage so the current seal for the mask is good.  And his compliance is excellent at 100% with 7 hours 11 minutes nightly.  In order to help him not having as many central apneas arising I would be willing to change the upper limit of pressure down to 17 or 16 however I am not sure that that will help the obstructive apneas as much.  05/05/22 ALL (mychart): Christopher Parsons is a 63 y.o. male here today for follow up for OSA on CPAP. He is doing well. He continues therapy nightly for about 7.5 hours each night. He denies concerns with supplies or machine. He is eligible for a new CPAP. He reports difficulty with previous HST results and had split night study in 2018.     04/29/2021 ALL:  Christopher Parsons returns for CPAP follow up. He continues to do well on therapy. He can tell a significant difference in sleep quality and daytime energy levels if he does not use CPAP. He denies concerns with machine or supplies. Set up in 2018. He has noted fluctuating AHI on his mobile app.     04/30/2020 ALL:  Christopher Parsons is a 63 y.o. male here today for follow up for OSA on CPAP. He is  doing very well on therapy. He is using a full face mask and feels it is the best fit for him. He monitors for leak at home but has not been able to identify specific contributors. He does have facial hair that could contribute to leak. He is feeling great and notes benefit of CPAP therapy.   Compliance report dated 03/30/2020 to 04/28/2020 reveals he has used CPAP 30 of the past 30 days for compliance of 100%.  He is CPAP greater than 4 hours 28 of the past 30 days for compliance of 93%.  Average usage was 6 hours and 44 minutes.  Residual AHI was 6.1 on 10 to 16 cm of water  pressure and an EPR of 2.  There was a significant leak noted in the 95th percentile of 33.7.  HISTORY: (copied from Dr Dohmeier's note on  05/01/2019)  UPDATE 05-01-2019, Christopher Parsons, a meanwhile 63 year old male returns for CPAP Compliance.  He reports having a good experience most nights , having good air seals. Many nights there is a problem with the air seal, and he has facial hair and is a mouth breathier as well. BMI 30 kg/m2. He lost 12 pounds.   He has been 100% compliant CPAP user and 90% by time for the 30 days preceding 26 April 2019.  Average use at home is 6 hours and 40 minutes the pressure is set at 12 cmH2O of a 3 cm EPR also his machine allows auto titration.  The residual AHI has risen to 9.6/h and the vast majority of these events is obstructive in nature.  I do think this is possibly at least due to higher air leaks just over the last 14 days in comparison to the previous 15 days I would much like to increase the pressure slightly in order to control the obstructive apneas, hopefully not increasing central apneas in the process.    REVIEW OF SYSTEMS: Out of a complete 14 system review of symptoms, the patient complains only of the following symptoms, none and all other reviewed systems are negative.  ESS 5/24  ALLERGIES: No Known Allergies  HOME MEDICATIONS: Outpatient Medications Prior to Visit  Medication Sig Dispense Refill   Multiple Vitamin (MULTIVITAMIN) tablet Take 1 tablet by mouth daily.     omeprazole  (PRILOSEC) 40 MG capsule TAKE 1 CAPSULE BY MOUTH DAILY 90 capsule 3   No facility-administered medications prior to visit.    PAST MEDICAL HISTORY: Past Medical History:  Diagnosis Date   GERD (gastroesophageal reflux disease)    Heart murmur    Hypothyroidism    Mitral valve prolapse    echo 12/14 on paper chart   OSA on CPAP     PAST SURGICAL HISTORY: Past Surgical History:  Procedure Laterality Date   COLONOSCOPY     COLONOSCOPY WITH PROPOFOL  N/A 11/06/2021   Procedure: COLONOSCOPY WITH PROPOFOL ;  Surgeon: Marnee Sink, MD;  Location: [facility];  Service: Endoscopy;   Laterality: N/A;   ESOPHAGEAL DILATION N/A 01/27/2018   Procedure: ESOPHAGEAL DILATION;  Surgeon: Marnee Sink, MD;  Location: [facility];  Service: Endoscopy;  Laterality: N/A;   ESOPHAGEAL DILATION  02/28/2021   Procedure: ESOPHAGEAL DILATION;  Surgeon: Marnee Sink, MD;  Location: [facility];  Service: Endoscopy;;   ESOPHAGOGASTRODUODENOSCOPY     ESOPHAGOGASTRODUODENOSCOPY N/A 11/18/2023   Procedure: EGD (ESOPHAGOGASTRODUODENOSCOPY);  Surgeon: Toledo, Alphonsus Jeans, MD;  Location: [facility] END[facility]COPY;  Service: Endoscopy;  Laterality: N/A;   ESOPHAGOGASTRODUODENOSCOPY (EGD) WITH PROPOFOL  N/A 01/18/2015  Procedure: ESOPHAGOGASTRODUODENOSCOPY (EGD) WITH PROPOFOL  with dialtion;  Surgeon: Marnee Sink, MD;  Location: [facility];  Service: Endoscopy;  Laterality: N/A;   ESOPHAGOGASTRODUODENOSCOPY (EGD) WITH PROPOFOL  N/A 05/17/2015   Procedure: ESOPHAGOGASTRODUODENOSCOPY (EGD) WITH PROPOFOL , WITH DIALATION;  Surgeon: Marnee Sink, MD;  Location: [facility];  Service: Endoscopy;  Laterality: N/A;   ESOPHAGOGASTRODUODENOSCOPY (EGD) WITH PROPOFOL  N/A 12/10/2017   Procedure: ESOPHAGOGASTRODUODENOSCOPY (EGD) WITH PROPOFOL ;  Surgeon: Marnee Sink, MD;  Location: [facility];  Service: Endoscopy;  Laterality: N/A;   ESOPHAGOGASTRODUODENOSCOPY (EGD) WITH PROPOFOL  N/A 01/27/2018   Procedure: ESOPHAGOGASTRODUODENOSCOPY (EGD) WITH PROPOFOL ;  Surgeon: Marnee Sink, MD;  Location: [facility];  Service: Endoscopy;  Laterality: N/A;   ESOPHAGOGASTRODUODENOSCOPY (EGD) WITH PROPOFOL  N/A 02/28/2021   Procedure: ESOPHAGOGASTRODUODENOSCOPY (EGD) WITH PROPOFOL ;  Surgeon: Marnee Sink, MD;  Location: [facility];  Service: Endoscopy;  Laterality: N/A;   GANGLION CYST EXCISION Left    wrist   TONSILLECTOMY     VASECTOMY      FAMILY HISTORY: Family History  Problem Relation Age of Onset   Hyperlipidemia Mother    Cancer Father 14       lung cancer   Kidney cancer Father         Underwent nephrectomy   Throat cancer Father    Healthy Sister    Healthy Brother    Healthy Brother     SOCIAL HISTORY: Social History   Socioeconomic History   Marital status: Married    Spouse name: Not on file   Number of children: 3   Years of education: 12   Highest education level: Master's degree (e.g., MA, MS, MEng, MEd, MSW, MBA)  Occupational History   Not on file  Tobacco Use   Smoking status: Never   Smokeless tobacco: Never  Vaping Use   Vaping status: Never Used  Substance and Sexual Activity   Alcohol use: Yes    Alcohol/week: 5.0 standard drinks of alcohol    Types: 5 Shots of liquor per week    Comment: 1-2 drinks daily   Drug use: No   Sexual activity: Yes    Birth control/protection: Surgical    Comment: vasectomy  Other Topics Concern   Not on file  Social History Narrative   Not on file   Social Drivers of Health   Financial Resource Strain: Low Risk  (02/19/2023)   Overall Financial Resource Strain (CARDIA)    Difficulty of Paying Living Expenses: Not hard at all  Food Insecurity: No Food Insecurity (02/19/2023)   Hunger Vital Sign    Worried About Running Out of Food in the Last Year: Never true    Ran Out of Food in the Last Year: Never true  Transportation Needs: No Transportation Needs (02/19/2023)   PRAPARE - Administrator, Civil Service (Medical): No    Lack of Transportation (Non-Medical): No  Physical Activity: Sufficiently Active (02/19/2023)   Exercise Vital Sign    Days of Exercise per Week: 5 days    Minutes of Exercise per Session: 40 min  Stress: No Stress Concern Present (02/19/2023)   [employer] of Occupational Health - Occupational Stress Questionnaire    Feeling of Stress : Not at all  Social Connections: Socially Integrated (02/19/2023)   Social Connection and Isolation Panel    Frequency of Communication with Friends and Family: Once a week    Frequency of Social Gatherings with Friends and Family:  Twice a week    Attends Religious Services: 1  to 4 times per year    Active Member of Clubs or Organizations: Yes    Attends Banker Meetings: 1 to 4 times per year    Marital Status: Married  Catering manager Violence: Not on file     PHYSICAL EXAM  Vitals:   12/29/23 1252  BP: (!) 144/82  Pulse: 94  SpO2: 98%  Weight: 214 lb 8 oz (97.3 kg)  Height: 5' 10 (1.778 m)     Body mass index is 30.78 kg/m.  Generalized: Well developed, in no acute distress  Cardiology: normal rate and rhythm, no murmur noted Respiratory: clear to auscultation bilaterally  Neurological examination  Mentation: Alert oriented to time, place, history taking. Follows all commands speech and language fluent Cranial nerve II-XII: Pupils were equal round reactive to light. Extraocular movements were full, visual field were full  Motor: The motor testing reveals 5 over 5 strength of all 4 extremities. Good symmetric motor tone is noted throughout.  Gait and station: Gait is normal.    DIAGNOSTIC DATA (LABS, IMAGING, TESTING) - I reviewed patient records, labs, notes, testing and imaging myself where available.      No data to display           Lab Results  Component Value Date   WBC 7.6 01/25/2023   HGB 15.2 01/25/2023   HCT 44.5 01/25/2023   MCV 92 01/25/2023   PLT 306 01/25/2023      Component Value Date/Time   NA 140 01/25/2023 0818   K 4.4 01/25/2023 0818   CL 104 01/25/2023 0818   CO2 24 01/25/2023 0818   GLUCOSE 95 01/25/2023 0818   BUN 11 01/25/2023 0818   CREATININE 0.97 01/25/2023 0818   CALCIUM 9.5 01/25/2023 0818   PROT 6.9 01/25/2023 0818   ALBUMIN 4.4 01/25/2023 0818   AST 16 01/25/2023 0818   ALT 18 01/25/2023 0818   ALKPHOS 44 01/25/2023 0818   BILITOT 0.4 01/25/2023 0818   GFRNONAA 77 01/12/2020 0821   GFRAA 89 01/12/2020 0821   Lab Results  Component Value Date   CHOL 194 01/25/2023   HDL 42 01/25/2023   LDLCALC 126 (H) 01/25/2023   TRIG 148  01/25/2023   CHOLHDL 4.6 01/25/2023   Lab Results  Component Value Date   HGBA1C 5.8 (H) 01/25/2023   No results found for: [id number] Lab Results  Component Value Date   TSH 3.070 01/25/2023     ASSESSMENT AND PLAN 64 y.o. year old male  has a past medical history of GERD (gastroesophageal reflux disease), Heart murmur, Hypothyroidism, Mitral valve prolapse, and OSA on CPAP. here with     ICD-10-CM   1. Obstructive sleep apnea treated with continuous positive airway pressure (CPAP)  G47.33 For home use only DME continuous positive airway pressure (CPAP)       Christopher Parsons is doing well on CPAP therapy.  Compliance report reveals excellent compliance. He is feeling well and noting benefit of therapy. Residual AHI is now 6. I will recheck report in 4-6 weeks to ensure AHI remains stable. He was encouraged to continue using CPAP nightly and for greater than 4 hours each night. We will update supply orders as indicated. Risks of untreated sleep apnea review and education materials provided. Healthy lifestyle habits encouraged. Will provide letter of medical necessity for traveling with CPAP per his request. He will follow up in 1 year, sooner if needed. He verbalizes understanding and agreement with this plan.  Orders Placed This Encounter  Procedures   For home use only DME continuous positive airway pressure (CPAP)    Heated Humidity with all supplies as needed    Length of Need:   Lifetime    Patient has OSA or probable OSA:   Yes    Is the patient currently using CPAP in the home:   Yes    Settings:   Other see comments    CPAP supplies needed:   Mask, headgear, cushions, filters, heated tubing and water  chamber      No orders of the defined types were placed in this encounter.     Terrilyn Fick, FNP-C 12/29/2023, 1:08 PM [facility] [address], [address] [place], [place] [address] [phone]

## 2023-12-28 NOTE — Patient Instructions (Addendum)
 Please continue using your CPAP regularly. While your insurance requires that you use CPAP at least 4 hours each night on 70% of the nights, I recommend, that you not skip any nights and use it throughout the night if you can. Getting used to CPAP and staying with the treatment long term does take time and patience and discipline. Untreated obstructive sleep apnea when it is moderate to severe can have an adverse impact on cardiovascular health and raise her risk for heart disease, arrhythmias, hypertension, congestive heart failure, stroke and diabetes. Untreated obstructive sleep apnea causes sleep disruption, nonrestorative sleep, and sleep deprivation. This can have an impact on your day to day functioning and cause daytime sleepiness and impairment of cognitive function, memory loss, mood disturbance, and problems focussing. Using CPAP regularly can improve these symptoms.  We will update supply orders, today. Have a great trip!  Follow up in 1 year

## 2023-12-29 ENCOUNTER — Ambulatory Visit: Payer: Managed Care, Other (non HMO) | Admitting: Family Medicine

## 2023-12-29 ENCOUNTER — Encounter: Payer: Self-pay | Admitting: Family Medicine

## 2023-12-29 VITALS — BP 144/82 | HR 94 | Ht 70.0 in | Wt 214.5 lb

## 2023-12-29 DIAGNOSIS — G4733 Obstructive sleep apnea (adult) (pediatric): Secondary | ICD-10-CM | POA: Diagnosis not present

## 2024-01-24 ENCOUNTER — Ambulatory Visit (INDEPENDENT_AMBULATORY_CARE_PROVIDER_SITE_OTHER): Payer: Self-pay | Admitting: Family Medicine

## 2024-01-24 ENCOUNTER — Encounter: Payer: Self-pay | Admitting: Family Medicine

## 2024-01-24 VITALS — BP 126/74 | HR 57 | Ht 70.0 in | Wt 211.0 lb

## 2024-01-24 DIAGNOSIS — Z130 Encounter for screening for diseases of the blood and blood-forming organs and certain disorders involving the immune mechanism: Secondary | ICD-10-CM

## 2024-01-24 DIAGNOSIS — E559 Vitamin D deficiency, unspecified: Secondary | ICD-10-CM

## 2024-01-24 DIAGNOSIS — I2510 Atherosclerotic heart disease of native coronary artery without angina pectoris: Secondary | ICD-10-CM

## 2024-01-24 DIAGNOSIS — G4733 Obstructive sleep apnea (adult) (pediatric): Secondary | ICD-10-CM | POA: Diagnosis not present

## 2024-01-24 DIAGNOSIS — Z131 Encounter for screening for diabetes mellitus: Secondary | ICD-10-CM | POA: Diagnosis not present

## 2024-01-24 DIAGNOSIS — E7800 Pure hypercholesterolemia, unspecified: Secondary | ICD-10-CM

## 2024-01-24 DIAGNOSIS — K219 Gastro-esophageal reflux disease without esophagitis: Secondary | ICD-10-CM

## 2024-01-24 DIAGNOSIS — Z0000 Encounter for general adult medical examination without abnormal findings: Secondary | ICD-10-CM

## 2024-01-24 DIAGNOSIS — I288 Other diseases of pulmonary vessels: Secondary | ICD-10-CM

## 2024-01-24 DIAGNOSIS — E039 Hypothyroidism, unspecified: Secondary | ICD-10-CM

## 2024-01-24 NOTE — Progress Notes (Signed)
 Complete physical exam   Patient: Christopher Parsons   DOB: 1961-02-20   63 y.o. Male  MRN: [id number] Visit Date: 01/24/2024  Today's healthcare provider: Rockie Agent, MD   Chief Complaint  Patient presents with   Annual Exam   Care Management    Pattern of eating:General   Are you exercising:No    Vaccine: none        Subjective    Christopher Parsons is a 63 y.o. male who presents today for a complete physical exam.    He does not have additional problems to discuss today.   Discussed the use of AI scribe software for clinical note transcription with the patient, who gave verbal consent to proceed.  History of Present Illness Christopher Parsons is a 63 year old male who presents for an annual physical exam.  He has a history of hypercholesterolemia, adult hypothyroidism, vitamin D  deficiency, GERD, coronary artery calcinosis, dilated pulmonary trunk, and obstructive sleep apnea.  He has received two doses of the Shingrix vaccine, with the first dose on May 16, 2021, and the second dose on August 01, 2021. He is up to date with vaccinations, including the RSV vaccine.  He follows a general diet and does not engage in regular exercise. He has scheduled an appointment for lab work, which will include tests for diabetes screening, anemia screening, a complete metabolic panel, cholesterol panel, vitamin D  levels, and thyroid function tests (TSH, T4, T3).  He has seen a urologist and has been signed off. His last PSA test was in the past year.     Past Medical History:  Diagnosis Date   GERD (gastroesophageal reflux disease)    Heart murmur    Hypothyroidism    Mitral valve prolapse    echo 12/14 on paper chart   OSA on CPAP    Past Surgical History:  Procedure Laterality Date   COLONOSCOPY     COLONOSCOPY WITH PROPOFOL  N/A 11/06/2021   Procedure: COLONOSCOPY WITH PROPOFOL ;  Surgeon: Jinny Carmine, MD;  Location: [facility] END[facility]COPY;   Service: Endoscopy;  Laterality: N/A;   ESOPHAGEAL DILATION N/A 01/27/2018   Procedure: ESOPHAGEAL DILATION;  Surgeon: Jinny Carmine, MD;  Location: [facility]ERY [facility];  Service: Endoscopy;  Laterality: N/A;   ESOPHAGEAL DILATION  02/28/2021   Procedure: ESOPHAGEAL DILATION;  Surgeon: Jinny Carmine, MD;  Location: [facility];  Service: Endoscopy;;   ESOPHAGOGASTRODUODENOSCOPY     ESOPHAGOGASTRODUODENOSCOPY N/A 11/18/2023   Procedure: EGD (ESOPHAGOGASTRODUODENOSCOPY);  Surgeon: Toledo, Ladell POUR, MD;  Location: [facility] ENDOSCOPY;  Service: Endoscopy;  Laterality: N/A;   ESOPHAGOGASTRODUODENOSCOPY (EGD) WITH PROPOFOL  N/A 01/18/2015   Procedure: ESOPHAGOGASTRODUODENOSCOPY (EGD) WITH PROPOFOL  with dialtion;  Surgeon: Carmine Jinny, MD;  Location: [facility] SURGERY CNTR;  Service: Endoscopy;  Laterality: N/A;   ESOPHAGOGASTRODUODENOSCOPY (EGD) WITH PROPOFOL  N/A 05/17/2015   Procedure: ESOPHAGOGASTRODUODENOSCOPY (EGD) WITH PROPOFOL , WITH DIALATION;  Surgeon: Carmine Jinny, MD;  Location: [facility] SURGERY [facility]TR;  Service: Endoscopy;  Laterality: N/A;   ESOPHAGOGASTRODUODENOSCOPY (EGD) WITH PROPOFOL  N/A 12/10/2017   Procedure: ESOPHAGOGASTRODUODENOSCOPY (EGD) WITH PROPOFOL ;  Surgeon: Jinny Carmine, MD;  Location: [facility] SURGERY [facility]TR;  Service: Endoscopy;  Laterality: N/A;   ESOPHAGOGASTRODUODENOSCOPY (EGD) WITH PROPOFOL  N/A 01/27/2018   Procedure: ESOPHAGOGASTRODUODENOSCOPY (EGD) WITH PROPOFOL ;  Surgeon: Jinny Carmine, MD;  Location: [facility] SUR[facility]ERY [facility];  Service: Endoscopy;  Laterality: N/A;   ESOPHAGOGASTRODUODENOSCOPY (EGD) WITH PROPOFOL  N/A 02/28/2021   Procedure: ESOPHAGOGASTRODUODENOSCOPY (EGD) WITH PROPOFOL ;  Surgeon: Jinny Carmine, MD;  Location: [facility];  Service:  Endoscopy;  Laterality: N/A;   GANGLION CYST EXCISION Left    wrist   TONSILLECTOMY     VASECTOMY     Social History   Socioeconomic History   Marital status: Married    Spouse name: Not on file   Number of children: 3   Years of  education: 12   Highest education level: Master's degree (e.g., MA, MS, MEng, MEd, MSW, MBA)  Occupational History   Not on file  Tobacco Use   Smoking status: Never   Smokeless tobacco: Never  Vaping Use   Vaping status: Never Used  Substance and Sexual Activity   Alcohol use: Yes    Alcohol/week: 5.0 standard drinks of alcohol    Types: 5 Shots of liquor per week    Comment: 1-2 drinks daily   Drug use: No   Sexual activity: Yes    Birth control/protection: Surgical    Comment: vasectomy  Other Topics Concern   Not on file  Social History Narrative   Not on file   Social Drivers of Health   Financial Resource Strain: Low Risk  (01/20/2024)   Overall Financial Resource Strain (CARDIA)    Difficulty of Paying Living Expenses: Not hard at all  Food Insecurity: No Food Insecurity (01/20/2024)   Hunger Vital Sign    Worried About Running Out of Food in the Last Year: Never true    Ran Out of Food in the Last Year: Never true  Transportation Needs: No Transportation Needs (01/20/2024)   PRAPARE - Administrator, Civil Service (Medical): No    Lack of Transportation (Non-Medical): No  Physical Activity: Sufficiently Active (01/20/2024)   Exercise Vital Sign    Days of Exercise per Week: 4 days    Minutes of Exercise per Session: 50 min  Stress: No Stress Concern Present (01/20/2024)   [employer] of Occupational Health - Occupational Stress Questionnaire    Feeling of Stress: Not at all  Social Connections: Socially Integrated (01/20/2024)   Social Connection and Isolation Panel    Frequency of Communication with Friends and Family: Three times a week    Frequency of Social Gatherings with Friends and Family: Three times a week    Attends Religious Services: More than 4 times per year    Active Member of Clubs or Organizations: Yes    Attends Banker Meetings: More than 4 times per year    Marital Status: Married  Catering manager Violence: Not At  Risk (01/24/2024)   Humiliation, Afraid, Rape, and Kick questionnaire    Fear of Current or Ex-Partner: No    Emotionally Abused: No    Physically Abused: No    Sexually Abused: No   Family Status  Relation Name Status   Mother  Alive   Father  Deceased   Sister  Alive   Brother  Alive   Brother  Alive  No partnership data on file   Family History  Problem Relation Age of Onset   Hyperlipidemia Mother    Cancer Father 40       lung cancer   Kidney cancer Father        Underwent nephrectomy   Throat cancer Father    Healthy Sister    Healthy Brother    Healthy Brother    No Known Allergies   Medications: Outpatient Medications Prior to Visit  Medication Sig   Multiple Vitamin (MULTIVITAMIN) tablet Take 1 tablet by mouth daily.   omeprazole  (PRILOSEC) 40  MG capsule TAKE 1 CAPSULE BY MOUTH DAILY   No facility-administered medications prior to visit.    Review of Systems  Last CBC Lab Results  Component Value Date   WBC 7.6 01/25/2023   HGB 15.2 01/25/2023   HCT 44.5 01/25/2023   MCV 92 01/25/2023   MCH 31.4 01/25/2023   RDW 13.1 01/25/2023   PLT 306 01/25/2023   Last metabolic panel Lab Results  Component Value Date   GLUCOSE 95 01/25/2023   NA 140 01/25/2023   K 4.4 01/25/2023   CL 104 01/25/2023   CO2 24 01/25/2023   BUN 11 01/25/2023   CREATININE 0.97 01/25/2023   EGFR 88 01/25/2023   CALCIUM 9.5 01/25/2023   PHOS 3.0 01/11/2017   PROT 6.9 01/25/2023   ALBUMIN 4.4 01/25/2023   LABGLOB 2.5 01/25/2023   AGRATIO 1.5 01/22/2022   BILITOT 0.4 01/25/2023   ALKPHOS 44 01/25/2023   AST 16 01/25/2023   ALT 18 01/25/2023   Last lipids Lab Results  Component Value Date   CHOL 194 01/25/2023   HDL 42 01/25/2023   LDLCALC 126 (H) 01/25/2023   TRIG 148 01/25/2023   CHOLHDL 4.6 01/25/2023   Last hemoglobin A1c Lab Results  Component Value Date   HGBA1C 5.8 (H) 01/25/2023   Last thyroid functions Lab Results  Component Value Date   TSH 3.070  01/25/2023   Last vitamin D  Lab Results  Component Value Date   VD25OH 37.8 01/25/2023   Last vitamin B12 and Folate No results found for: VITAMINB12, FOLATE     Objective    BP 126/74   Pulse (!) 57   Ht 5' 10 (1.778 m)   Wt 211 lb (95.7 kg)   SpO2 98%   BMI 30.28 kg/m  BP Readings from Last 3 Encounters:  01/24/24 126/74  12/29/23 (!) 144/82  12/23/23 (!) 154/80   Wt Readings from Last 3 Encounters:  01/24/24 211 lb (95.7 kg)  12/29/23 214 lb 8 oz (97.3 kg)  12/23/23 213 lb (96.6 kg)        Physical Exam Constitutional:      General: He is not in acute distress.    Appearance: Normal appearance. He is not ill-appearing.  HENT:     Mouth/Throat:     Mouth: Mucous membranes are moist.  Eyes:     General: No scleral icterus.       Right eye: No discharge.        Left eye: No discharge.     Extraocular Movements: Extraocular movements intact.     Conjunctiva/sclera: Conjunctivae normal.     Pupils: Pupils are equal, round, and reactive to light.  Cardiovascular:     Rate and Rhythm: Normal rate and regular rhythm.     Heart sounds: Normal heart sounds.  Pulmonary:     Effort: Pulmonary effort is normal.     Breath sounds: Normal breath sounds.  Abdominal:     General: Bowel sounds are normal. There is no distension.     Palpations: Abdomen is soft.     Tenderness: There is no abdominal tenderness.  Musculoskeletal:        General: No deformity or signs of injury. Normal range of motion.     Cervical back: Normal range of motion and neck supple. No tenderness.     Right lower leg: No edema.     Left lower leg: No edema.  Lymphadenopathy:     Cervical: No cervical adenopathy.  Neurological:  Mental Status: He is alert and oriented to person, place, and time.     Cranial Nerves: No cranial nerve deficit.     Motor: No weakness.     Gait: Gait normal.  Psychiatric:        Mood and Affect: Mood normal.        Behavior: Behavior normal.        Last depression screening scores    01/24/2024    8:26 AM 01/21/2023    8:20 AM 05/25/2022    4:01 PM  PHQ 2/9 Scores  PHQ - 2 Score 0 0 0  PHQ- 9 Score 0      Last fall risk screening    05/25/2022    4:01 PM  Fall Risk   Falls in the past year? 0  Number falls in past yr: 0  Injury with Fall? 0  Risk for fall due to : No Fall Risks    Last Audit-C alcohol use screening    01/20/2024    8:53 AM  Alcohol Use Disorder Test (AUDIT)  1. How often do you have a drink containing alcohol? 3  2. How many drinks containing alcohol do you have on a typical day when you are drinking? 0  3. How often do you have six or more drinks on one occasion? 0  AUDIT-C Score 3   4. How often during the last year have you found that you were not able to stop drinking once you had started? 0  5. How often during the last year have you failed to do what was normally expected from you because of drinking? 0  6. How often during the last year have you needed a first drink in the morning to get yourself going after a heavy drinking session? 0  7. How often during the last year have you had a feeling of guilt of remorse after drinking? 0  8. How often during the last year have you been unable to remember what happened the night before because you had been drinking? 0  9. Have you or someone else been injured as a result of your drinking? 0  10. Has a relative or friend or a doctor or another health worker been concerned about your drinking or suggested you cut down? 0  Alcohol Use Disorder Identification Test Final Score (AUDIT) 3      Patient-reported   A score of 3 or more in women, and 4 or more in men indicates increased risk for alcohol abuse, EXCEPT if all of the points are from question 1   No results found for any visits on 01/24/24.  Assessment & Plan    Routine Health Maintenance and Physical Exam  Immunization History  Administered Date(s) Administered   Influenza Inj Mdck Quad Pf  06/11/2017   Influenza,inj,Quad PF,6+ Mos 07/04/2013, 03/05/2019, 03/28/2023   Influenza-Unspecified 06/12/2017, 03/05/2019, 04/02/2020, 04/03/2022   Moderna SARS-COV2 Booster Vaccination 03/29/2021   PFIZER(Purple Top)SARS-COV-2 Vaccination 09/23/2019, 10/17/2019, 05/06/2020   Pfizer Covid-19 Vaccine Bivalent Booster 74yrs & up 10/17/2020   Tdap 06/23/2010, 05/25/2022   Unspecified SARS-COV-2 Vaccination 04/03/2022   Zoster Recombinant(Shingrix) 05/16/2021    Health Maintenance  Topic Date Due   Zoster Vaccines- Shingrix (2 of 2) 07/11/2021   INFLUENZA VACCINE  02/11/2024   Colonoscopy  11/06/2028   DTaP/Tdap/Td (3 - Td or Tdap) 05/25/2032   COVID-19 Vaccine  Completed   Hepatitis C Screening  Completed   HIV Screening  Completed   Hepatitis B Vaccines  Aged Out   HPV VACCINES  Aged Out   Meningococcal B Vaccine  Aged Out    Problem List Items Addressed This Visit       Cardiovascular and Mediastinum   Dilated pulmonary trunk (HCC)   Coronary artery calcinosis     Respiratory   Obstructive sleep apnea treated with continuous positive airway pressure (CPAP)   Relevant Orders   CBC   CMP14+EGFR     Digestive   Acid reflux   Relevant Orders   CBC     Endocrine   Adult hypothyroidism   Relevant Orders   TSH+T4F+T3Free     Other   Vitamin D  deficiency   Relevant Orders   VITAMIN D  25 Hydroxy (Vit-D Deficiency, Fractures)   Hypercholesterolemia   Relevant Orders   Lipid panel   Annual physical exam - Primary   Annual Physical Examination A 63 year old male presents for an annual physical examination, including discussions on diet, exercise, and necessary screenings. He does not exercise and follows a general diet. Lab work is scheduled for Friday to include diabetes and anemia screening, as well as assessments of kidney, liver, electrolyte, cholesterol, vitamin D , and thyroid function. - Order A1c for diabetes screening - Order CBC for anemia screening - Order  CMP for kidney, liver, and electrolyte assessment - Order lipid panel for cholesterol assessment - Order vitamin D  levels - Order TSH, T4, and T3 for thyroid function assessment      Other Visit Diagnoses       Screening for deficiency anemia       Relevant Orders   CBC     Screening for diabetes mellitus       Relevant Orders   Hemoglobin A1c       Assessment & Plan   Hypercholesterolemia He has hypercholesterolemia. A lipid panel is ordered to assess current cholesterol levels.  Adult Hypothyroidism He has adult hypothyroidism. Thyroid function tests (TSH, T4, T3) are ordered to monitor the condition.  Vitamin D  Deficiency He has vitamin D  deficiency. Vitamin D  levels are ordered to assess current status.  Gastroesophageal Reflux Disease (GERD) He has GERD.  Coronary Artery Calcinosis He has coronary artery calcinosis.  Dilated Pulmonary Trunk He has a dilated pulmonary trunk.  Obstructive Sleep Apnea He has obstructive sleep apnea.  General Health Maintenance He is up to date on the shingles vaccine, having received two doses of Shingrix, with the second dose documented in January 2023. The last PSA test was normal, and the next PSA test is due next year. - Ensure documentation of the second Shingrix dose in the health record - Schedule PSA test for next year  Follow-up Lab work is scheduled for Friday. He will be contacted with lab results. Follow-up is as needed, with the next annual exam scheduled for next year. - Perform lab work on scheduled date - Contact him with lab results - Schedule next annual exam for next year   Return in about 1 year (around 01/23/2025) for CPE.       Rockie Agent, MD  [facility] [phone] (phone) [phone] (fax)  [facility]

## 2024-01-24 NOTE — Patient Instructions (Addendum)
 It was a pleasure to see you today!  Thank you for choosing [facility] for your primary care.   Today you were seen for your annual physical  Please review the attached information regarding helpful preventive health topics.     Best Wishes,   Dr. Roxan Hockey

## 2024-01-24 NOTE — Assessment & Plan Note (Signed)
 Annual Physical Examination A 63 year old male presents for an annual physical examination, including discussions on diet, exercise, and necessary screenings. He does not exercise and follows a general diet. Lab work is scheduled for Friday to include diabetes and anemia screening, as well as assessments of kidney, liver, electrolyte, cholesterol, vitamin D , and thyroid function. - Order A1c for diabetes screening - Order CBC for anemia screening - Order CMP for kidney, liver, and electrolyte assessment - Order lipid panel for cholesterol assessment - Order vitamin D  levels - Order TSH, T4, and T3 for thyroid function assessment

## 2024-01-29 LAB — LIPID PANEL
Chol/HDL Ratio: 4.8 ratio (ref 0.0–5.0)
Cholesterol, Total: 205 mg/dL — ABNORMAL HIGH (ref 100–199)
HDL: 43 mg/dL (ref >39)
LDL Chol Calc (NIH): 143 mg/dL — ABNORMAL HIGH (ref 0–99)
Triglycerides: 105 mg/dL (ref 0–149)
VLDL Cholesterol Cal: 19 mg/dL (ref 5–40)

## 2024-01-29 LAB — CBC
Hematocrit: 46.7 % (ref 37.5–51.0)
Hemoglobin: 15.4 g/dL (ref 13.0–17.7)
MCH: 30.9 pg (ref 26.6–33.0)
MCHC: 33 g/dL (ref 31.5–35.7)
MCV: 94 fL (ref 79–97)
Platelets: 327 x10E3/uL (ref 150–450)
RBC: 4.99 x10E6/uL (ref 4.14–5.80)
RDW: 12.7 % (ref 11.6–15.4)
WBC: 8.1 x10E3/uL (ref 3.4–10.8)

## 2024-01-29 LAB — TSH+T4F+T3FREE
Free T4: 1.4 ng/dL (ref 0.82–1.77)
T3, Free: 3.1 pg/mL (ref 2.0–4.4)
TSH: 2.49 u[IU]/mL (ref 0.450–4.500)

## 2024-01-29 LAB — CMP14+EGFR
ALT: 25 IU/L (ref 0–44)
AST: 20 IU/L (ref 0–40)
Albumin: 4.4 g/dL (ref 3.9–4.9)
Alkaline Phosphatase: 50 IU/L (ref 44–121)
BUN/Creatinine Ratio: 12 (ref 10–24)
BUN: 14 mg/dL (ref 8–27)
Bilirubin Total: 0.5 mg/dL (ref 0.0–1.2)
CO2: 23 mmol/L (ref 20–29)
Calcium: 9.7 mg/dL (ref 8.6–10.2)
Chloride: 101 mmol/L (ref 96–106)
Creatinine, Ser: 1.13 mg/dL (ref 0.76–1.27)
Globulin, Total: 2.9 g/dL (ref 1.5–4.5)
Glucose: 92 mg/dL (ref 70–99)
Potassium: 4.7 mmol/L (ref 3.5–5.2)
Sodium: 140 mmol/L (ref 134–144)
Total Protein: 7.3 g/dL (ref 6.0–8.5)
eGFR: 73 mL/min/1.73 (ref >59)

## 2024-01-29 LAB — HEMOGLOBIN A1C
Est. average glucose Bld gHb Est-mCnc: 114 mg/dL
Hgb A1c MFr Bld: 5.6 % (ref 4.8–5.6)

## 2024-01-29 LAB — VITAMIN D 25 HYDROXY (VIT D DEFICIENCY, FRACTURES): Vit D, 25-Hydroxy: 39.7 ng/mL (ref 30.0–100.0)

## 2024-02-02 ENCOUNTER — Ambulatory Visit: Payer: Self-pay | Admitting: Family Medicine

## 2024-02-04 ENCOUNTER — Other Ambulatory Visit: Payer: Self-pay

## 2024-02-04 MED ORDER — ROSUVASTATIN CALCIUM 10 MG PO TABS: 10.0000 mg | ORAL_TABLET | Freq: Every day | ORAL | 1 refills | Status: DC

## 2024-02-21 ENCOUNTER — Telehealth: Payer: Self-pay | Admitting: Family Medicine

## 2024-02-21 NOTE — Telephone Encounter (Signed)
 Please attach 90 day report to assess stability of AHI. TY!

## 2024-02-21 NOTE — Telephone Encounter (Signed)
 SABRA

## 2024-02-23 ENCOUNTER — Other Ambulatory Visit: Payer: Self-pay

## 2024-02-23 ENCOUNTER — Telehealth: Payer: Self-pay | Admitting: Family Medicine

## 2024-02-23 MED ORDER — OMEPRAZOLE 40 MG PO CPDR: 40.0000 mg | DELAYED_RELEASE_CAPSULE | Freq: Every day | ORAL | 3 refills | Status: AC

## 2024-02-23 NOTE — Telephone Encounter (Signed)
 [facility] has requested a refill of omeprazole  (PRILOSEC) 40 MG capsule.

## 2024-02-23 NOTE — Telephone Encounter (Signed)
 Converted into refill request and sent in.

## 2024-03-15 ENCOUNTER — Encounter: Payer: Self-pay | Admitting: Family Medicine

## 2024-03-15 DIAGNOSIS — E039 Hypothyroidism, unspecified: Secondary | ICD-10-CM

## 2024-03-15 DIAGNOSIS — I288 Other diseases of pulmonary vessels: Secondary | ICD-10-CM

## 2024-03-15 DIAGNOSIS — G4733 Obstructive sleep apnea (adult) (pediatric): Secondary | ICD-10-CM

## 2024-03-20 MED ORDER — COVID-19 MRNA VAC-TRIS(PFIZER) 30 MCG/0.3ML IM SUSY: 0.3000 mL | PREFILLED_SYRINGE | Freq: Once | INTRAMUSCULAR | 0 refills | Status: AC

## 2024-04-24 ENCOUNTER — Encounter: Payer: Self-pay | Admitting: Family Medicine

## 2024-05-05 ENCOUNTER — Encounter: Payer: Self-pay | Admitting: Family Medicine

## 2024-05-05 ENCOUNTER — Other Ambulatory Visit: Payer: Self-pay | Admitting: Family Medicine

## 2024-05-05 DIAGNOSIS — E7800 Pure hypercholesterolemia, unspecified: Secondary | ICD-10-CM

## 2024-05-05 NOTE — Progress Notes (Signed)
 F/u lipid panel ordered after crestor  10mg  for 3 months  The 10-year ASCVD risk score (Arnett DK, et al., 2019) is: 11.9%

## 2024-05-09 ENCOUNTER — Other Ambulatory Visit: Payer: Self-pay | Admitting: Family Medicine

## 2024-05-09 ENCOUNTER — Ambulatory Visit: Payer: Self-pay | Admitting: Family Medicine

## 2024-05-09 ENCOUNTER — Other Ambulatory Visit: Payer: Self-pay

## 2024-05-09 ENCOUNTER — Encounter: Payer: Self-pay | Admitting: Family Medicine

## 2024-05-09 LAB — LIPID PANEL
Chol/HDL Ratio: 3.1 ratio (ref 0.0–5.0)
Cholesterol, Total: 144 mg/dL (ref 100–199)
HDL: 47 mg/dL (ref >39)
LDL Chol Calc (NIH): 77 mg/dL (ref 0–99)
Triglycerides: 112 mg/dL (ref 0–149)
VLDL Cholesterol Cal: 20 mg/dL (ref 5–40)

## 2024-05-09 MED ORDER — ROSUVASTATIN CALCIUM 10 MG PO TABS
10.0000 mg | ORAL_TABLET | Freq: Every day | ORAL | 2 refills | Status: AC
Start: 2024-05-09 — End: ?

## 2024-05-09 MED ORDER — ROSUVASTATIN CALCIUM 10 MG PO TABS: 10.0000 mg | ORAL_TABLET | Freq: Every day | ORAL | 1 refills | Status: DC

## 2024-05-10 ENCOUNTER — Encounter: Payer: Self-pay | Admitting: Family Medicine

## 2024-05-10 DIAGNOSIS — Z79899 Other long term (current) drug therapy: Secondary | ICD-10-CM

## 2024-05-10 DIAGNOSIS — E7800 Pure hypercholesterolemia, unspecified: Secondary | ICD-10-CM

## 2024-05-12 LAB — COMPREHENSIVE METABOLIC PANEL WITH GFR
ALT: 30 IU/L (ref 0–44)
AST: 23 IU/L (ref 0–40)
Albumin: 4.5 g/dL (ref 3.9–4.9)
Alkaline Phosphatase: 47 IU/L (ref 47–123)
BUN/Creatinine Ratio: 15 (ref 10–24)
BUN: 18 mg/dL (ref 8–27)
Bilirubin Total: 0.4 mg/dL (ref 0.0–1.2)
CO2: 23 mmol/L (ref 20–29)
Calcium: 10 mg/dL (ref 8.6–10.2)
Chloride: 102 mmol/L (ref 96–106)
Creatinine, Ser: 1.21 mg/dL (ref 0.76–1.27)
Globulin, Total: 2.9 g/dL (ref 1.5–4.5)
Glucose: 104 mg/dL — ABNORMAL HIGH (ref 70–99)
Potassium: 4.3 mmol/L (ref 3.5–5.2)
Sodium: 141 mmol/L (ref 134–144)
Total Protein: 7.4 g/dL (ref 6.0–8.5)
eGFR: 67 mL/min/1.73 (ref >59)

## 2024-05-14 ENCOUNTER — Ambulatory Visit: Payer: Self-pay | Admitting: Family Medicine

## 2024-08-18 ENCOUNTER — Encounter: Payer: Self-pay | Admitting: Family Medicine

## 2024-08-18 ENCOUNTER — Ambulatory Visit: Admitting: Family Medicine

## 2024-08-18 VITALS — BP 140/70 | HR 53 | Temp 97.9°F | Ht 70.0 in | Wt 225.4 lb

## 2024-08-18 DIAGNOSIS — E039 Hypothyroidism, unspecified: Secondary | ICD-10-CM

## 2024-08-18 DIAGNOSIS — R42 Dizziness and giddiness: Secondary | ICD-10-CM

## 2024-08-18 DIAGNOSIS — R001 Bradycardia, unspecified: Secondary | ICD-10-CM

## 2024-08-18 DIAGNOSIS — K219 Gastro-esophageal reflux disease without esophagitis: Secondary | ICD-10-CM

## 2024-08-18 DIAGNOSIS — R635 Abnormal weight gain: Secondary | ICD-10-CM

## 2024-08-18 DIAGNOSIS — R011 Cardiac murmur, unspecified: Secondary | ICD-10-CM

## 2024-08-18 DIAGNOSIS — G4733 Obstructive sleep apnea (adult) (pediatric): Secondary | ICD-10-CM

## 2024-08-18 DIAGNOSIS — I2510 Atherosclerotic heart disease of native coronary artery without angina pectoris: Secondary | ICD-10-CM

## 2024-08-18 DIAGNOSIS — E7800 Pure hypercholesterolemia, unspecified: Secondary | ICD-10-CM

## 2024-08-18 NOTE — Progress Notes (Unsigned)
 "  Established Patient Office Visit  Patient ID: Christopher Parsons, male    DOB: 14-Apr-1961  Age: 64 y.o. MRN: [id number] PCP: Sharma Coyer, MD  Chief Complaint  Patient presents with   Hypoglycemia    Patient was concerned with glucose at last BMP, was elevated at 104. Has gained some weight since starting statin and is hard keeping this off    Bradycardia    Patient monitors HR at home via smart bed and was advised avg HR during the night is 49. Patient bought a pulse ox and resting HR is usually in 50s-60s which he feels is low. Does have some occurences of dizziness/ lightheaded with postural changes     Subjective:     HPI  Discussed the use of AI scribe software for clinical note transcription with the patient, who gave verbal consent to proceed.  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   Chronic nasal congestion 03/29/2017   Hypersomnia with sleep apnea 03/29/2017   Fracture of skull (HCC) 06/27/2015   Hypercholesterolemia 06/27/2015   Adult hypothyroidism 06/27/2015   Snores 06/27/2015   Problems with swallowing and mastication    Stricture and stenosis of esophagus    Gastritis    Vitamin D  deficiency 11/18/2009   Acid reflux 05/23/2009   Cardiac murmur 05/23/2009      ROS    Objective:     BP (!) 140/70 (BP Location: Left Arm, Patient Position: Sitting, Cuff Size: Normal)   Pulse (!) 53   Temp 97.9 F (36.6 C) (Oral)   Ht 5' 10 (1.778 m)   Wt 225 lb 6.4 oz (102.2 kg)   SpO2 100%   BMI 32.34 kg/m  BP  Readings from Last 3 Encounters:  08/18/24 (!) 140/70  01/24/24 126/74  12/29/23 (!) 144/82   Wt Readings from Last 3 Encounters:  08/18/24 225 lb 6.4 oz (102.2 kg)  01/24/24 211 lb (95.7 kg)  12/29/23 214 lb 8 oz (97.3 kg)      Physical Exam Vitals reviewed.  Constitutional:      General: He is not in acute distress.    Appearance: Normal appearance. He is not ill-appearing.  Cardiovascular:     Rate and Rhythm: Normal rate and regular rhythm.  Pulmonary:     Effort: Pulmonary effort is normal. No respiratory distress.     Breath sounds: No wheezing, rhonchi or rales.  Neurological:     Mental Status: He is alert and oriented to person, place, and time.  Psychiatric:        Mood and Affect: Mood normal.        Behavior: Behavior normal.     {PhysExam Abridge (Optional):[id number]} No results found for any visits on 08/18/24.  Last CBC Lab Results  Component Value Date   WBC 8.1 01/28/2024   HGB 15.4 01/28/2024   HCT 46.7 01/28/2024   MCV 94 01/28/2024   MCH 30.9 01/28/2024   RDW 12.7 01/28/2024   PLT 327 01/28/2024   Last metabolic panel Lab Results  Component Value  Date   GLUCOSE 104 (H) 05/11/2024   NA 141 05/11/2024   K 4.3 05/11/2024   CL 102 05/11/2024   CO2 23 05/11/2024   BUN 18 05/11/2024   CREATININE 1.21 05/11/2024   EGFR 67 05/11/2024   CALCIUM  10.0 05/11/2024   PHOS 3.0 01/11/2017   PROT 7.4 05/11/2024   ALBUMIN 4.5 05/11/2024   LABGLOB 2.9 05/11/2024   AGRATIO 1.5 01/22/2022   BILITOT 0.4 05/11/2024   ALKPHOS 47 05/11/2024   AST 23 05/11/2024   ALT 30 05/11/2024   Last lipids Lab Results  Component Value Date   CHOL 144 05/08/2024   HDL 47 05/08/2024   LDLCALC 77 05/08/2024   TRIG 112 05/08/2024   CHOLHDL 3.1 05/08/2024   Last hemoglobin A1c Lab Results  Component Value Date   HGBA1C 5.6 01/28/2024   Last thyroid functions Lab Results  Component Value Date   TSH 2.490 01/28/2024   FREET4 1.40 01/28/2024   Last vitamin  D Lab Results  Component Value Date   VD25OH 39.7 01/28/2024   Last vitamin B12 and Folate No results found for: VITAMINB12, FOLATE    The 10-year ASCVD risk score (Arnett DK, et al., 2019) is: 10.3%  Outpatient Encounter Medications as of 08/18/2024  Medication Sig   Multiple Vitamin (MULTIVITAMIN) tablet Take 1 tablet by mouth daily.   omeprazole  (PRILOSEC) 40 MG capsule Take 1 capsule (40 mg total) by mouth daily.   rosuvastatin  (CRESTOR ) 10 MG tablet Take 1 tablet (10 mg total) by mouth daily.   No facility-administered encounter medications on file as of 08/18/2024.       Assessment & Plan:   Problem List Items Addressed This Visit   None   Assessment and Plan Assessment & Plan   *** had pneumococcal vaccine 03/22/24 at [facility]   No follow-ups on file.    Rockie Agent, MD [facility]  "

## 2024-08-24 ENCOUNTER — Ambulatory Visit: Admitting: Cardiology

## 2024-08-29 ENCOUNTER — Ambulatory Visit: Admitting: Pulmonary Disease

## 2024-12-26 ENCOUNTER — Ambulatory Visit: Admitting: Family Medicine

## 2024-12-28 ENCOUNTER — Ambulatory Visit: Admitting: Family Medicine

## 2025-01-24 ENCOUNTER — Encounter: Admitting: Family Medicine
